# Patient Record
Sex: Female | Born: 1970 | Race: White | Hispanic: No | Marital: Single | Smoking: Never smoker
Health system: Southern US, Community
[De-identification: ages and names within clinical notes are randomized; demographics above are authoritative.]

## PROBLEM LIST (undated history)

## (undated) ENCOUNTER — Emergency Department (HOSPITAL_COMMUNITY): Admission: EM | Payer: BC Managed Care – PPO

## (undated) DIAGNOSIS — F329 Major depressive disorder, single episode, unspecified: Secondary | ICD-10-CM

## (undated) DIAGNOSIS — F419 Anxiety disorder, unspecified: Secondary | ICD-10-CM

## (undated) DIAGNOSIS — M1990 Unspecified osteoarthritis, unspecified site: Secondary | ICD-10-CM

## (undated) DIAGNOSIS — E785 Hyperlipidemia, unspecified: Secondary | ICD-10-CM

## (undated) DIAGNOSIS — K219 Gastro-esophageal reflux disease without esophagitis: Secondary | ICD-10-CM

## (undated) DIAGNOSIS — T7840XA Allergy, unspecified, initial encounter: Secondary | ICD-10-CM

## (undated) DIAGNOSIS — F32A Depression, unspecified: Secondary | ICD-10-CM

## (undated) HISTORY — DX: Gastro-esophageal reflux disease without esophagitis: K21.9

## (undated) HISTORY — PX: TONSILLECTOMY: SUR1361

## (undated) HISTORY — DX: Major depressive disorder, single episode, unspecified: F32.9

## (undated) HISTORY — PX: FRACTURE SURGERY: SHX138

## (undated) HISTORY — PX: OTHER SURGICAL HISTORY: SHX169

## (undated) HISTORY — PX: EYE SURGERY: SHX253

## (undated) HISTORY — DX: Allergy, unspecified, initial encounter: T78.40XA

## (undated) HISTORY — PX: KNEE SURGERY: SHX244

## (undated) HISTORY — DX: Unspecified osteoarthritis, unspecified site: M19.90

## (undated) HISTORY — DX: Anxiety disorder, unspecified: F41.9

## (undated) HISTORY — DX: Depression, unspecified: F32.A

## (undated) HISTORY — DX: Hyperlipidemia, unspecified: E78.5

---

## 2004-06-07 ENCOUNTER — Ambulatory Visit: Payer: Self-pay | Admitting: Family Medicine

## 2004-10-22 ENCOUNTER — Ambulatory Visit: Payer: Self-pay | Admitting: Family Medicine

## 2004-11-29 ENCOUNTER — Encounter: Admission: RE | Admit: 2004-11-29 | Discharge: 2004-11-29 | Payer: Self-pay | Admitting: Family Medicine

## 2006-01-09 ENCOUNTER — Encounter: Admission: RE | Admit: 2006-01-09 | Discharge: 2006-01-09 | Payer: Self-pay | Admitting: Family Medicine

## 2006-01-13 ENCOUNTER — Encounter: Admission: RE | Admit: 2006-01-13 | Discharge: 2006-01-13 | Payer: Self-pay | Admitting: Family Medicine

## 2006-09-15 ENCOUNTER — Emergency Department: Payer: Self-pay | Admitting: Unknown Physician Specialty

## 2009-01-22 ENCOUNTER — Ambulatory Visit: Payer: Self-pay | Admitting: Orthopedic Surgery

## 2009-02-26 ENCOUNTER — Ambulatory Visit: Payer: Self-pay | Admitting: Orthopedic Surgery

## 2010-06-06 ENCOUNTER — Encounter: Payer: Self-pay | Admitting: Family Medicine

## 2010-09-13 ENCOUNTER — Ambulatory Visit: Payer: Self-pay | Admitting: Internal Medicine

## 2010-09-14 ENCOUNTER — Ambulatory Visit: Payer: Self-pay | Admitting: Internal Medicine

## 2011-03-22 ENCOUNTER — Other Ambulatory Visit: Payer: Self-pay | Admitting: Family Medicine

## 2011-03-22 ENCOUNTER — Ambulatory Visit
Admission: RE | Admit: 2011-03-22 | Discharge: 2011-03-22 | Disposition: A | Discharge status: Home / Self Care | Payer: 59 | Source: Ambulatory Visit | Attending: Family Medicine | Admitting: Family Medicine

## 2011-03-22 DIAGNOSIS — M2550 Pain in unspecified joint: Secondary | ICD-10-CM

## 2011-03-22 DIAGNOSIS — M549 Dorsalgia, unspecified: Secondary | ICD-10-CM

## 2012-06-26 ENCOUNTER — Emergency Department (HOSPITAL_COMMUNITY)
Admission: EM | Admit: 2012-06-26 | Discharge: 2012-06-27 | Disposition: A | Discharge status: Home / Self Care | Payer: 59 | Attending: Emergency Medicine | Admitting: Emergency Medicine

## 2012-06-26 ENCOUNTER — Emergency Department (HOSPITAL_COMMUNITY): Payer: 59

## 2012-06-26 ENCOUNTER — Encounter (HOSPITAL_COMMUNITY): Payer: Self-pay | Admitting: Emergency Medicine

## 2012-06-26 DIAGNOSIS — Z79899 Other long term (current) drug therapy: Secondary | ICD-10-CM | POA: Insufficient documentation

## 2012-06-26 DIAGNOSIS — Z791 Long term (current) use of non-steroidal anti-inflammatories (NSAID): Secondary | ICD-10-CM | POA: Insufficient documentation

## 2012-06-26 DIAGNOSIS — Y929 Unspecified place or not applicable: Secondary | ICD-10-CM | POA: Insufficient documentation

## 2012-06-26 DIAGNOSIS — R110 Nausea: Secondary | ICD-10-CM | POA: Insufficient documentation

## 2012-06-26 DIAGNOSIS — S93409A Sprain of unspecified ligament of unspecified ankle, initial encounter: Secondary | ICD-10-CM

## 2012-06-26 DIAGNOSIS — Y9389 Activity, other specified: Secondary | ICD-10-CM | POA: Insufficient documentation

## 2012-06-26 DIAGNOSIS — R296 Repeated falls: Secondary | ICD-10-CM | POA: Insufficient documentation

## 2012-06-26 NOTE — ED Notes (Signed)
PT. REPORTS LEFT ANKLE INJURY WHILE PRACTICING TAE KWON DO THIS EVENING .

## 2012-06-26 NOTE — ED Notes (Signed)
Pt remains in Xray

## 2012-06-26 NOTE — ED Notes (Signed)
Pt alert and mentating appropriately. Pt states rolled ankle today. Pt denies numbness and tingling. Pt denies dizziness/lightheadedness. Pt c/o some nausea from pain. Pt does not show signs of acute distress.

## 2012-06-26 NOTE — ED Provider Notes (Signed)
History  This chart was scribed for Kristine Lee, nurse practitioner working with Kristine Lee, by Kristine Lee, ED Scribe. This patient was seen in room TR06C/TR06C and the patient's care was started at 11:25 PM  CSN: [id number]  Arrival date & time 06/26/12  2204   First MD Initiated Contact with Patient 06/26/12 2325      Chief Complaint  Patient presents with  . Ankle Injury     Patient is a 42 y.o. female presenting with ankle pain. The history is provided by the patient. No language interpreter was used.  Ankle Pain Location:  Ankle Injury: yes   Mechanism of injury: fall   Fall:    Impact surface:  Athletic surface Ankle location:  L ankle Pain details:    Quality:  Aching   Radiates to:  Does not radiate   Severity:  Moderate   Onset quality:  Sudden   Timing:  Constant   Progression:  Unchanged Chronicity:  New Dislocation: yes (Per pt)   Foreign body present:  No foreign bodies Prior injury to area:  No Relieved by:  Nothing Associated symptoms: no back pain     Kristine Lee is a 42 y.o. female who presents to the Emergency Department complaining of sudden onset, non-changing, constant left ankle pain while practicing kicks at [employer] this evening. She states that she fell in mid kick landing on the left ankle dislocating it. She reports that when she grabbed the area, the ankle "popped" back into place. She reports that she has been able to bear weight on the ankle since the incident. She denies taking OTC medications PTA to improve symptoms. She states that she has an orthopedist with [facility] that she follows up with due to other injuries. She denies head trauma, LOC, CP and abdominal pain currently. She does not have a h/o chronic medical conditions and denies smoking and alcohol use.  History reviewed. No pertinent past medical history.  Past Surgical History  Procedure Laterality Date  . Knee surgery    . Feet surgery      No  family history on file.  History  Substance Use Topics  . Smoking status: Never Smoker   . Smokeless tobacco: Not on file  . Alcohol Use: No    No OB history provided.  Review of Systems  Cardiovascular: Negative for chest pain.  Gastrointestinal: Negative for abdominal pain.  Musculoskeletal: Negative for back pain.       Positive for left ankle pain  All other systems reviewed and are negative.    Allergies  Biaxin and Vancomycin  Home Medications   Current Outpatient Rx  Name  Route  Sig  Dispense  Refill  . cetirizine (ZYRTEC) 10 MG tablet   Oral   Take 10 mg by mouth daily.         . diclofenac (CATAFLAM) 50 MG tablet   Oral   Take 50 mg by mouth 3 (three) times daily as needed. Arthritis pain         . ibuprofen (ADVIL,MOTRIN) 200 MG tablet   Oral   Take 200 mg by mouth once. pain         . Naproxen Sodium (ALEVE PO)   Oral   Take 1 tablet by mouth daily. Arthritis pain         . omeprazole (PRILOSEC) 40 MG capsule   Oral   Take 40 mg by mouth daily.  Triage Vitals: BP 130/86  Pulse 84  Temp(Src) 97.7 F (36.5 C) (Oral)  Resp 16  SpO2 100%  LMP 04/05/2012  Physical Exam  Nursing note and vitals reviewed. Constitutional: She is oriented to person, place, and time. She appears well-developed and well-nourished. No distress.  HENT:  Head: Normocephalic and atraumatic.  Eyes: EOM are normal.  Neck: Neck supple. No tracheal deviation present.  Cardiovascular: Normal rate.   Pulmonary/Chest: Effort normal. No respiratory distress.  Musculoskeletal: Normal range of motion.  Edema and ecchymosis to the lateral left malleolus, neurovascularly intact  Neurological: She is alert and oriented to person, place, and time.  Skin: Skin is warm and dry.  Psychiatric: She has a normal mood and affect. Her behavior is normal.    ED Course  Procedures (including critical care time)  DIAGNOSTIC STUDIES: Oxygen Saturation is 100% on room  air, normal by my interpretation.    COORDINATION OF CARE: 11:29 PM-Informed pt of unclear xray results. Discussed treatment plan which includes CT scan of ankle per request by radiologist with pt at bedside and pt agreed to plan.   Labs Reviewed - No data to display Dg Ankle Complete Left  06/26/2012  *RADIOLOGY REPORT*  Clinical Data: Ankle inversion injury.  Soft tissue swelling.  LEFT ANKLE COMPLETE - 3+ VIEW  Comparison: None.  Findings: Soft tissue swelling overlies the lateral malleolus.  No cortical discontinuity to suggest malleolar fracture.  Plafond and talar dome appear intact.  Base of the fifth metatarsal appears intact. On the lateral projection, there is a suspected tibiotalar joint effusion.  There is also an unexpected linear calcification projecting over the sinus tarsi on the lateral projection - possibly a fracture fragment.  IMPRESSION:  1. Possible linear fracture fragment in the vicinity of the sinus tarsi.  It is possible that this simply represents an unusual projection of the anterior portion of the sustentaculum tali.  CT of the ankle is recommended for further characterization. 2.  Soft tissue swelling overlying the lateral malleolus.   Original Report Authenticated By: Van Clines, M.D.      No diagnosis found.  No fracture identified on CT.  Ankle sprain.  Splint, crutches, ortho follow-up (sees [facility]).  MDM   I personally performed the services described in this documentation, which was scribed in my presence. The recorded information has been reviewed and is accurate.         Norman Herrlich, NP 06/27/12 [phone]

## 2012-06-27 MED ORDER — OXYCODONE-ACETAMINOPHEN 5-325 MG PO TABS
2.0000 | ORAL_TABLET | Freq: Once | ORAL | Status: AC
  Administered 2012-06-27: 2 via ORAL
  Filled 2012-06-27: qty 2

## 2012-06-27 MED ORDER — OXYCODONE-ACETAMINOPHEN 5-325 MG PO TABS: 1.0000 | ORAL_TABLET | Freq: Four times a day (QID) | ORAL | Status: DC | PRN

## 2012-06-27 NOTE — ED Provider Notes (Signed)
Medical screening examination/treatment/procedure(s) were performed by non-physician practitioner and as supervising physician I was immediately available for consultation/collaboration.  Orpah Greek, MD 06/27/12 2259

## 2012-06-27 NOTE — Progress Notes (Signed)
Orthopedic Tech Progress Note Patient Details:  Kristine Lee 04-28-1971 [id number]  Ortho Devices Type of Ortho Device: ASO   [name] 06/27/2012, 12:54 AM

## 2012-06-27 NOTE — ED Notes (Signed)
Pt discharged.GCS 15

## 2012-09-07 ENCOUNTER — Ambulatory Visit: Payer: 59 | Admitting: Family Medicine

## 2012-09-07 ENCOUNTER — Ambulatory Visit: Payer: 59

## 2012-09-07 VITALS — BP 109/67 | HR 76 | Temp 97.8°F | Resp 16 | Ht 64.0 in | Wt 134.0 lb

## 2012-09-07 DIAGNOSIS — R059 Cough, unspecified: Secondary | ICD-10-CM

## 2012-09-07 DIAGNOSIS — R509 Fever, unspecified: Secondary | ICD-10-CM

## 2012-09-07 DIAGNOSIS — R05 Cough: Secondary | ICD-10-CM

## 2012-09-07 DIAGNOSIS — R0602 Shortness of breath: Secondary | ICD-10-CM

## 2012-09-07 DIAGNOSIS — J209 Acute bronchitis, unspecified: Secondary | ICD-10-CM

## 2012-09-07 LAB — POCT CBC
Granulocyte percent: 81.7 % — AB (ref 37–80)
HCT, POC: 45.2 % (ref 37.7–47.9)
Hemoglobin: 14.4 g/dL (ref 12.2–16.2)
Lymph, poc: 2.4 (ref 0.6–3.4)
MCH, POC: 29.4 pg (ref 27–31.2)
MCHC: 31.9 g/dL (ref 31.8–35.4)
MCV: 92.4 fL (ref 80–97)
MID (cbc): 0.7 (ref 0–0.9)
MPV: 9 fL (ref 0–99.8)
POC Granulocyte: 13.7 — AB (ref 2–6.9)
POC LYMPH PERCENT: 14.1 % (ref 10–50)
POC MID %: 4.2 %M (ref 0–12)
Platelet Count, POC: 274 10*3/uL (ref 142–424)
RBC: 4.89 M/uL (ref 4.04–5.48)
RDW, POC: 14.8 %
WBC: 16.8 10*3/uL — AB (ref 4.6–10.2)

## 2012-09-07 MED ORDER — FLUTICASONE PROPIONATE 50 MCG/ACT NA SUSP: 2.0000 | Freq: Every day | NASAL | Status: DC

## 2012-09-07 MED ORDER — ALBUTEROL SULFATE (2.5 MG/3ML) 0.083% IN NEBU
2.5000 mg | INHALATION_SOLUTION | Freq: Once | RESPIRATORY_TRACT | Status: AC
  Administered 2012-09-07: 2.5 mg via RESPIRATORY_TRACT

## 2012-09-07 MED ORDER — IPRATROPIUM-ALBUTEROL 20-100 MCG/ACT IN AERS: 1.0000 | INHALATION_SPRAY | Freq: Four times a day (QID) | RESPIRATORY_TRACT | Status: DC | PRN

## 2012-09-07 MED ORDER — LEVOFLOXACIN 500 MG PO TABS: 500.0000 mg | ORAL_TABLET | Freq: Every day | ORAL | Status: DC

## 2012-09-07 MED ORDER — IPRATROPIUM BROMIDE 0.02 % IN SOLN
0.5000 mg | Freq: Once | RESPIRATORY_TRACT | Status: AC
  Administered 2012-09-07: 0.5 mg via RESPIRATORY_TRACT

## 2012-09-07 NOTE — Patient Instructions (Addendum)
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
## 2012-09-25 ENCOUNTER — Ambulatory Visit: Payer: 59

## 2012-09-25 ENCOUNTER — Ambulatory Visit: Payer: 59 | Admitting: Family Medicine

## 2012-09-25 VITALS — BP 124/76 | HR 86 | Temp 99.2°F | Resp 17 | Ht 64.5 in | Wt 133.0 lb

## 2012-09-25 DIAGNOSIS — F411 Generalized anxiety disorder: Secondary | ICD-10-CM

## 2012-09-25 DIAGNOSIS — G4700 Insomnia, unspecified: Secondary | ICD-10-CM

## 2012-09-25 DIAGNOSIS — R062 Wheezing: Secondary | ICD-10-CM

## 2012-09-25 DIAGNOSIS — R0602 Shortness of breath: Secondary | ICD-10-CM

## 2012-09-25 DIAGNOSIS — F418 Other specified anxiety disorders: Secondary | ICD-10-CM

## 2012-09-25 LAB — POCT CBC
Granulocyte percent: 54 %G (ref 37–80)
HCT, POC: 40.3 % (ref 37.7–47.9)
Hemoglobin: 12.7 g/dL (ref 12.2–16.2)
MCV: 94.2 fL (ref 80–97)
POC Granulocyte: 3.8 (ref 2–6.9)
RBC: 4.28 M/uL (ref 4.04–5.48)
RDW, POC: 14.6 %

## 2012-09-25 LAB — GLUCOSE, POCT (MANUAL RESULT ENTRY): POC Glucose: 86 mg/dl (ref 70–99)

## 2012-09-25 MED ORDER — CLONAZEPAM 0.5 MG PO TABS: 0.2500 mg | ORAL_TABLET | Freq: Two times a day (BID) | ORAL | Status: DC | PRN

## 2012-09-25 MED ORDER — PREDNISONE 10 MG PO TABS: 10.0000 mg | ORAL_TABLET | Freq: Every day | ORAL | Status: DC

## 2012-09-25 MED ORDER — BECLOMETHASONE DIPROPIONATE 80 MCG/ACT IN AERS: 1.0000 | INHALATION_SPRAY | Freq: Two times a day (BID) | RESPIRATORY_TRACT | Status: DC

## 2012-09-25 MED ORDER — CITALOPRAM HYDROBROMIDE 20 MG PO TABS: 20.0000 mg | ORAL_TABLET | Freq: Every day | ORAL | Status: DC

## 2012-09-25 NOTE — Progress Notes (Signed)
Chest x-ray looks clear. Discussed patient Kristine Sorrel, PA. Agree with treatment and plan.

## 2012-09-25 NOTE — Patient Instructions (Signed)
Start the prednisone on Wed am Start the inhaler Tuesday night Start the Klonopin when you are comfortable Celexa - start on Friday with lunch - when the prednisone is gone take in the am
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
## 2012-10-20 IMAGING — CR DG CERVICAL SPINE 2 OR 3 VIEWS
3 series · 3 of 3 positions shown · non-contrast
Comparison: None.

CLINICAL DATA: Neck pain

CERVICAL SPINE - 2-3 VIEW

[w c-spine lat]
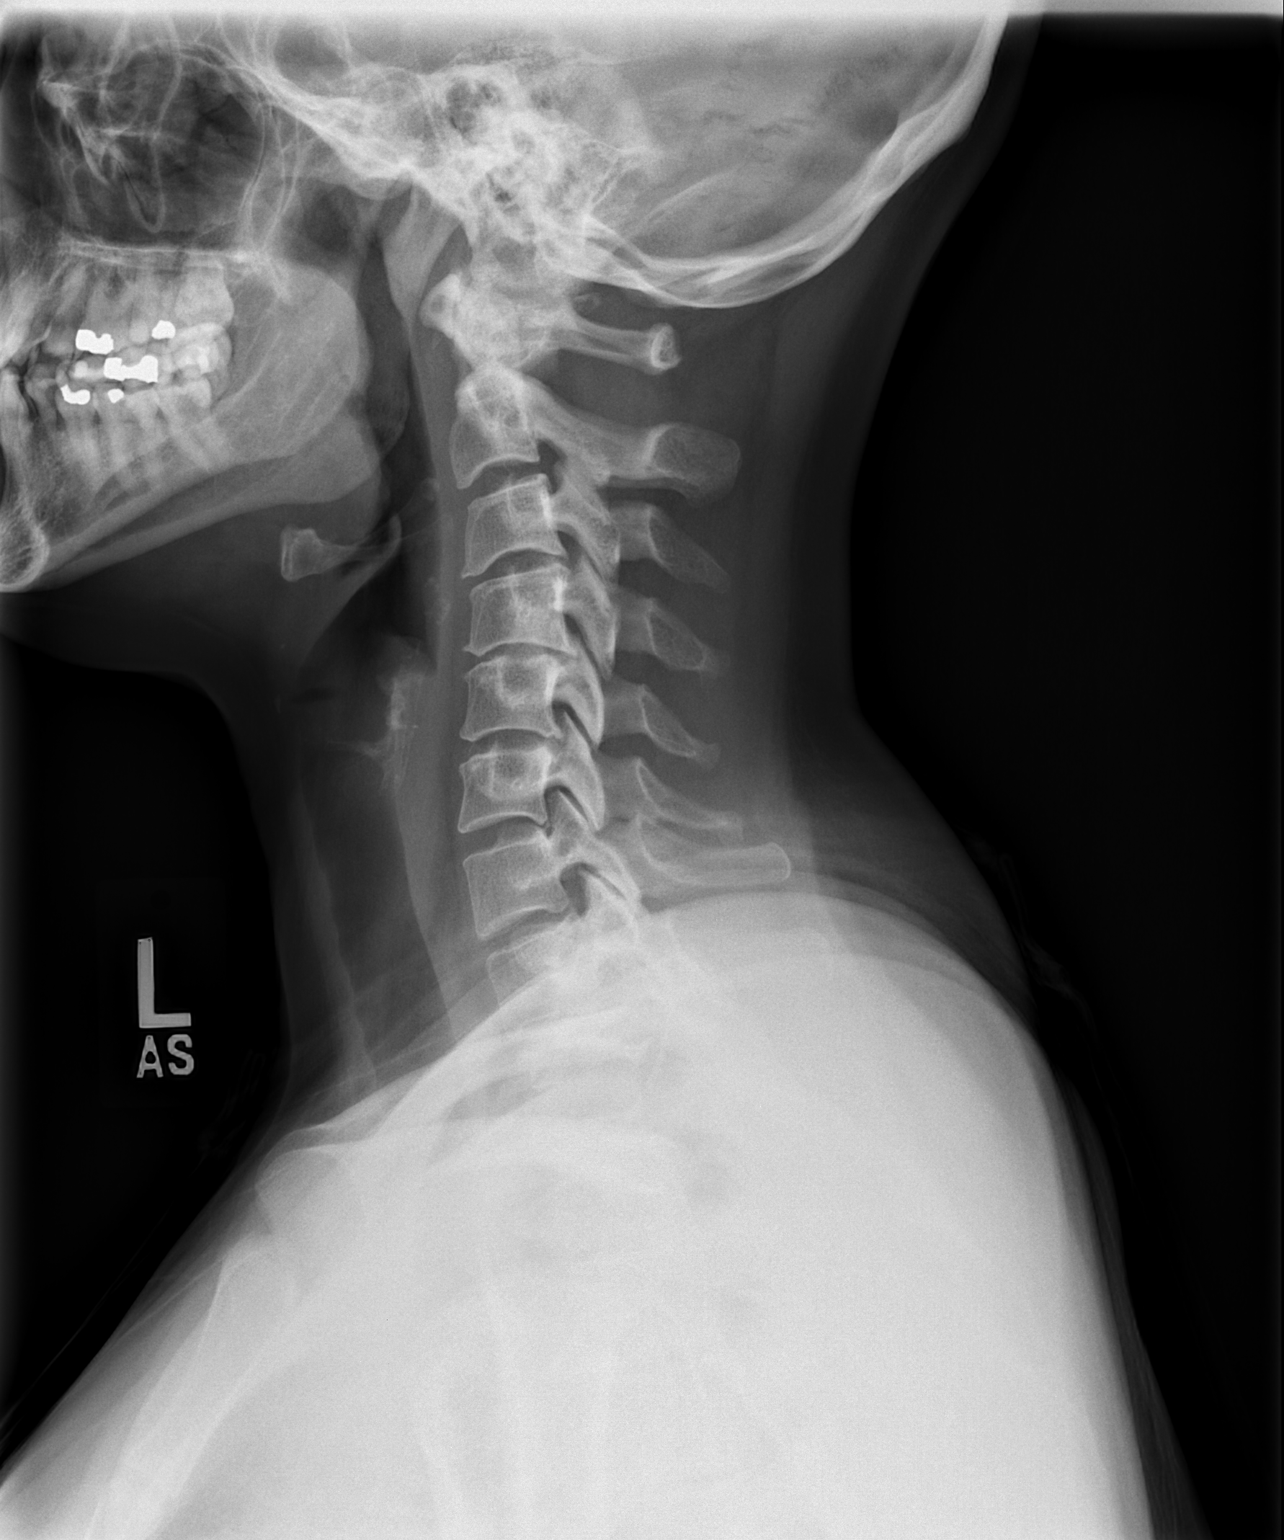

[w c-spine a.p. *]
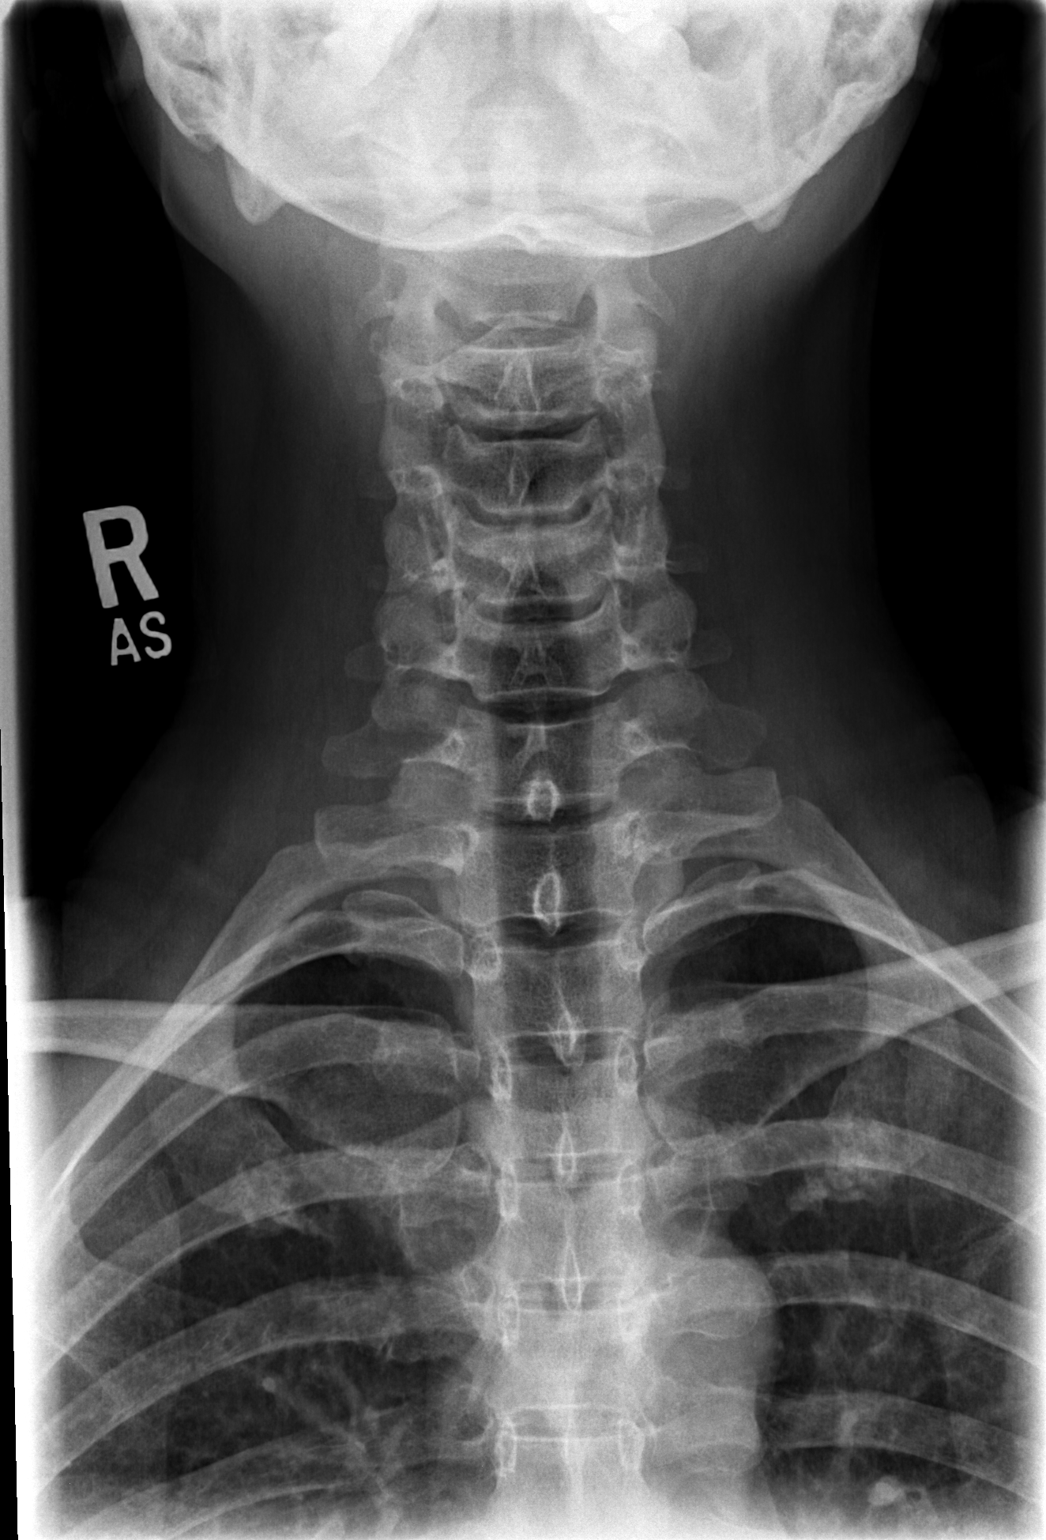

[w c-spine odontoid *]
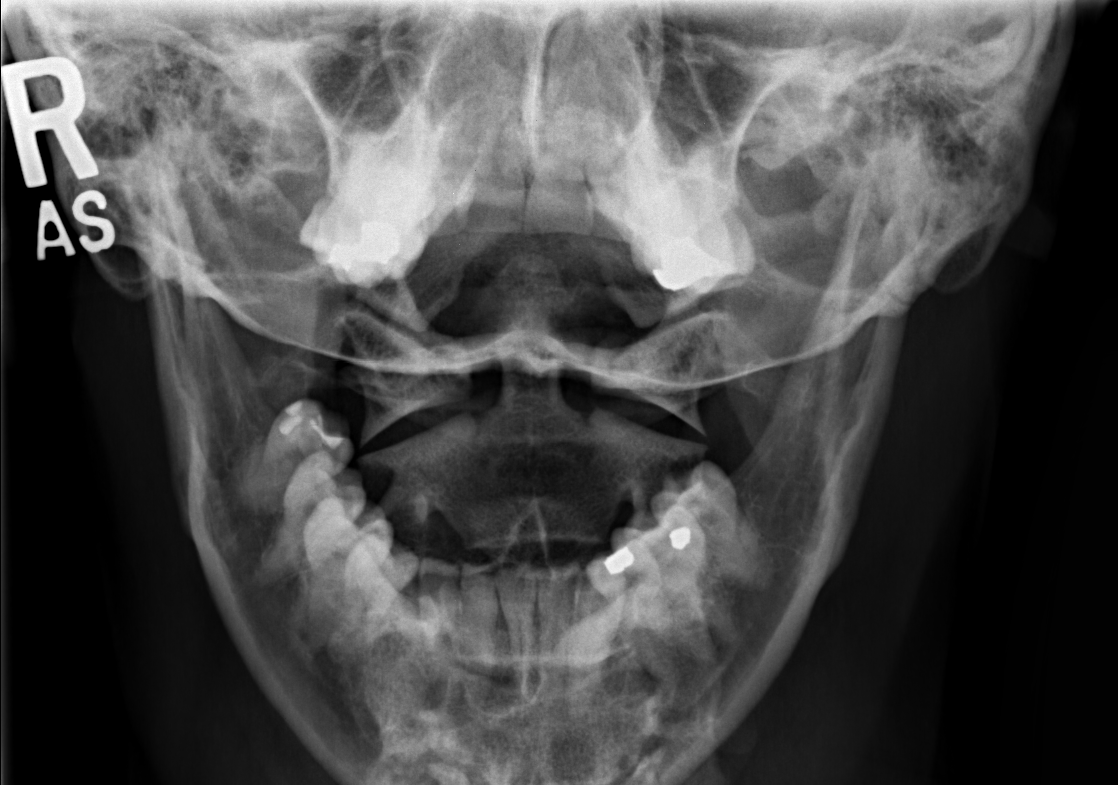

[3 of 3 positions shown; findings below may reference images not displayed]

FINDINGS: Reversal of the normal cervical lordosis.

No evidence of fracture or dislocation.  The vertebral body heights
are maintained.  The dens appears intact.  Lateral masses of C1 are
symmetric.

No prevertebral soft tissue swelling.

Mild multilevel degenerative changes, most prominent at C4-5.

Visualized lung apices are clear.
IMPRESSION: No fracture or dislocation is seen.

Mild multilevel degenerative changes, most prominent at C4-5.

## 2012-10-20 IMAGING — CR DG WRIST COMPLETE 3+V*R*
4 series · 4 of 4 positions shown · non-contrast
Comparison: None.

CLINICAL DATA: Polyarthralgia

RIGHT WRIST - COMPLETE 3+ VIEW

[x wrist pa right]
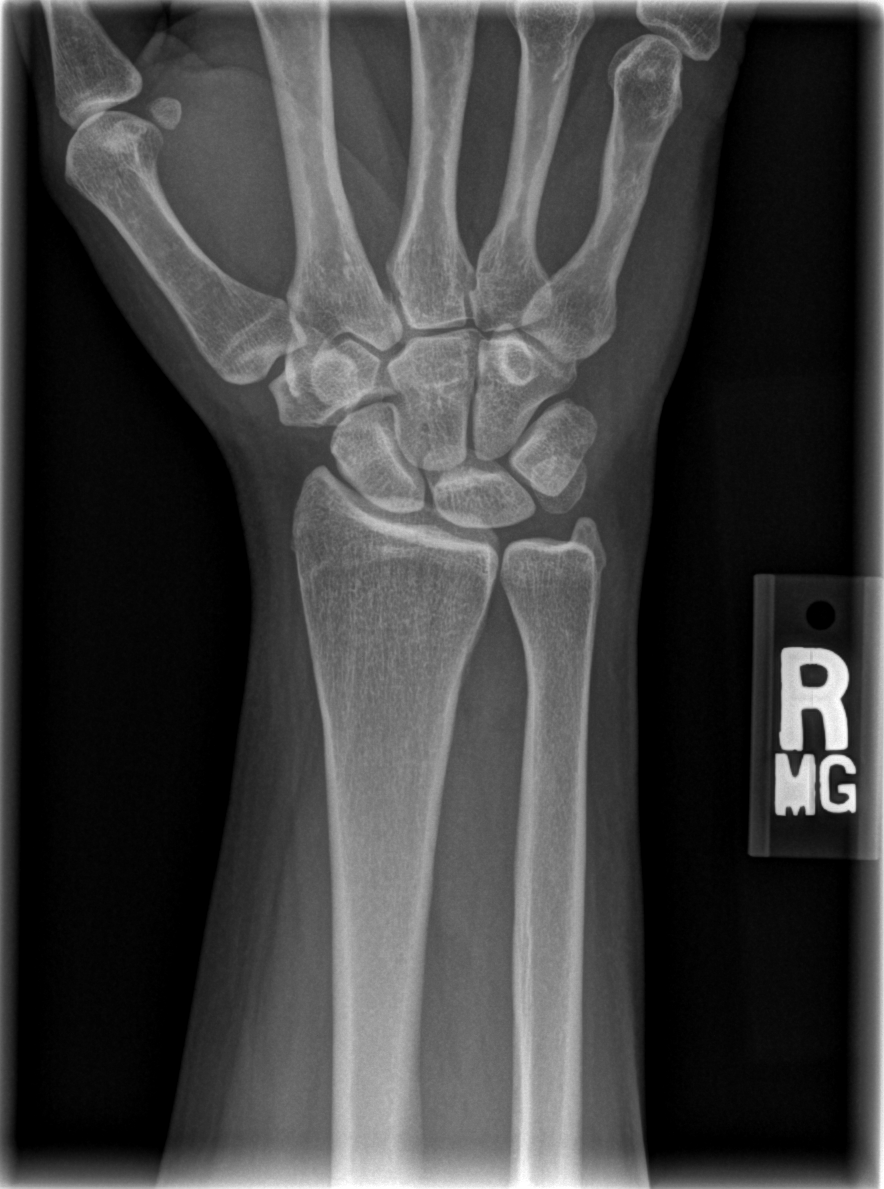

[x wrist obl right]
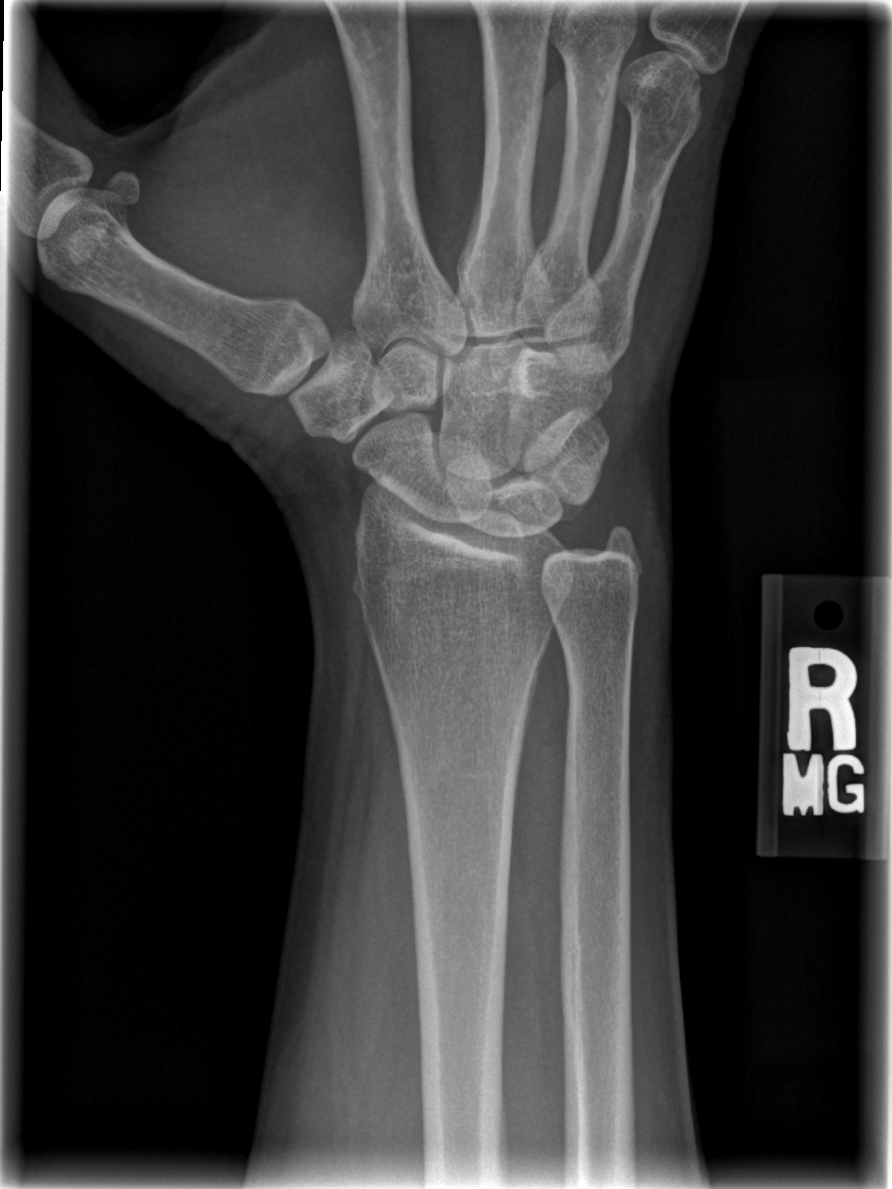

[x wrist lat right]
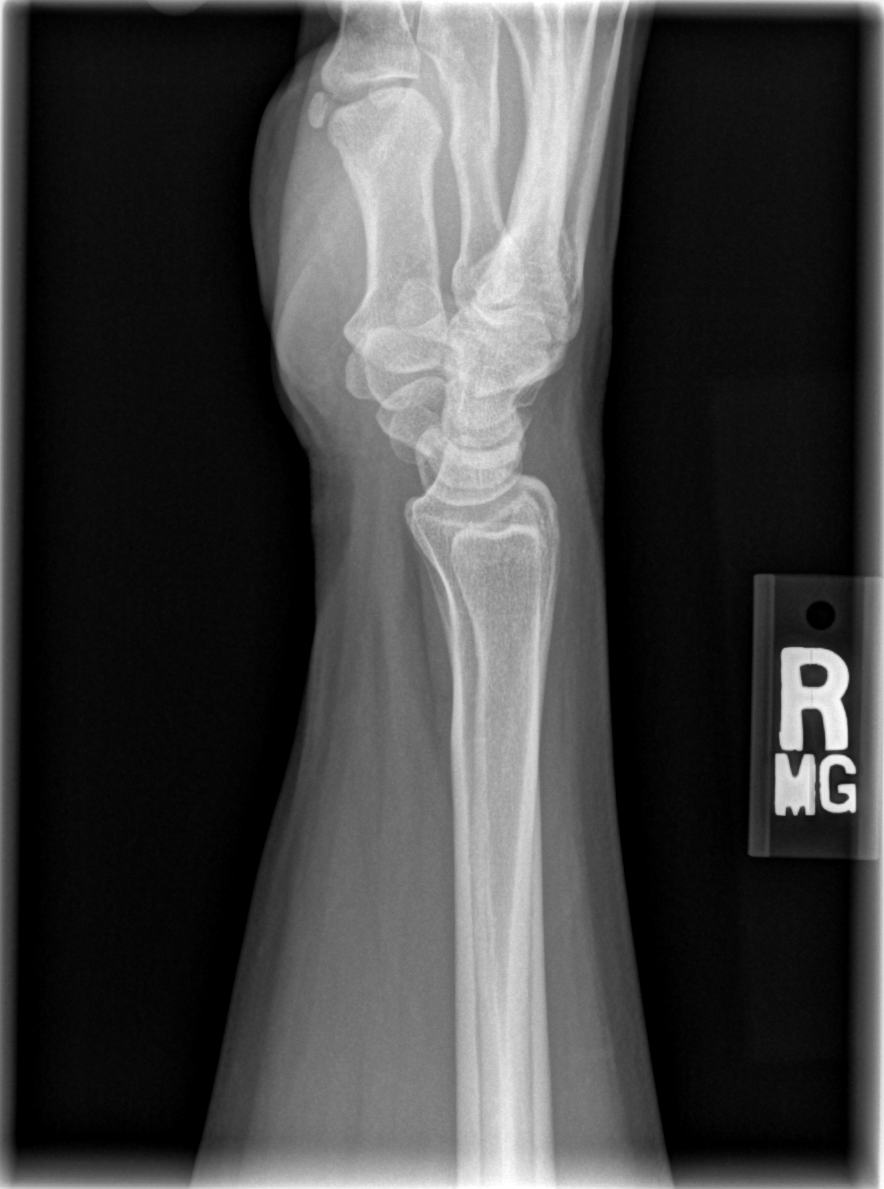

[x navicular]
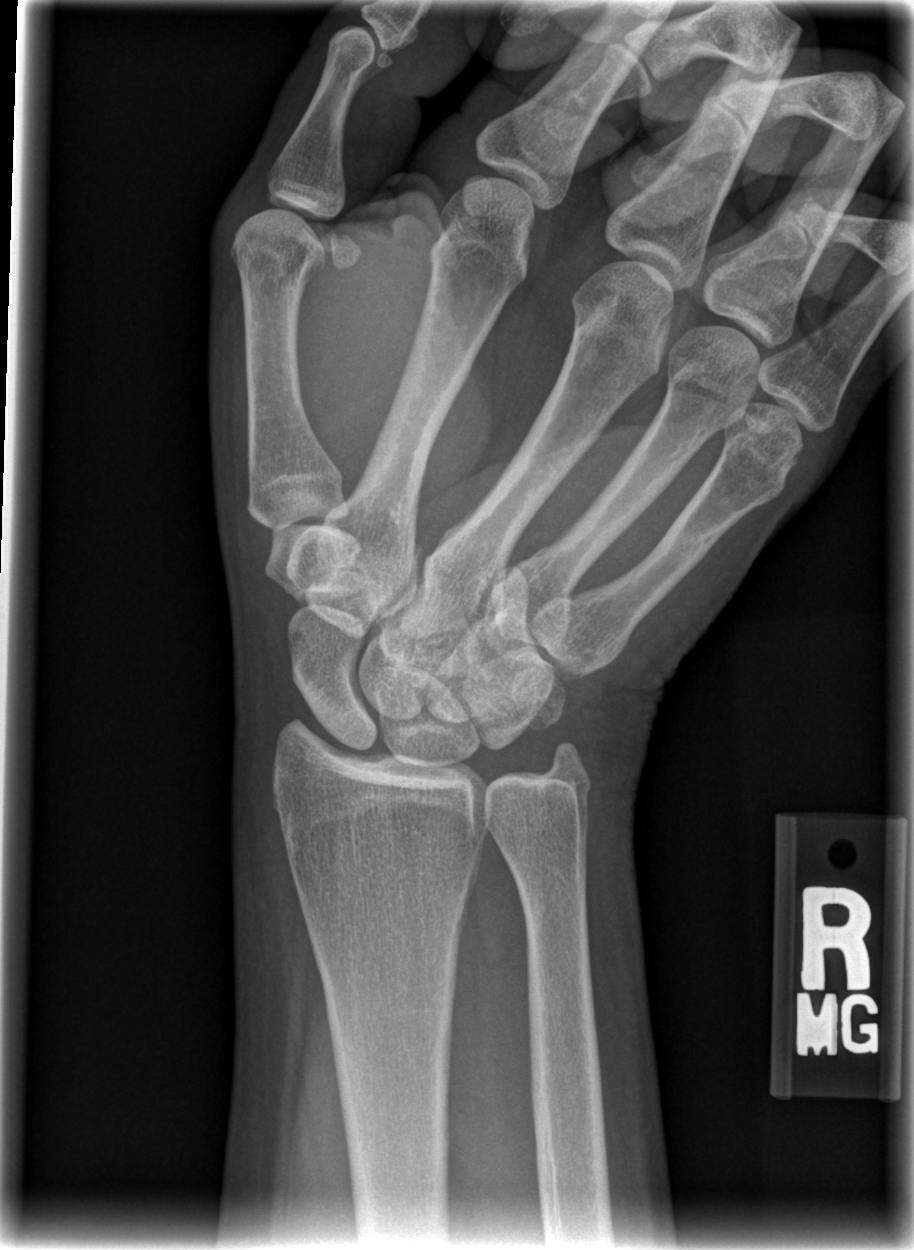

[4 of 4 positions shown; findings below may reference images not displayed]

FINDINGS: No acute fracture and no dislocation.  Small cysts in the
lateral triquetrum is of unknown significance.  Unremarkable soft
tissues.  Joint spaces are maintained.
IMPRESSION: No acute bony pathology.

## 2012-10-25 ENCOUNTER — Ambulatory Visit: Payer: 59 | Admitting: Family Medicine

## 2012-10-25 ENCOUNTER — Ambulatory Visit: Payer: 59

## 2012-10-25 VITALS — BP 161/66 | HR 68 | Temp 98.0°F | Resp 16 | Ht 64.0 in | Wt 133.0 lb

## 2012-10-25 DIAGNOSIS — G4700 Insomnia, unspecified: Secondary | ICD-10-CM

## 2012-10-25 DIAGNOSIS — F418 Other specified anxiety disorders: Secondary | ICD-10-CM

## 2012-10-25 DIAGNOSIS — F411 Generalized anxiety disorder: Secondary | ICD-10-CM

## 2012-10-25 DIAGNOSIS — M25519 Pain in unspecified shoulder: Secondary | ICD-10-CM

## 2012-10-25 DIAGNOSIS — F341 Dysthymic disorder: Secondary | ICD-10-CM

## 2012-10-25 DIAGNOSIS — M25511 Pain in right shoulder: Secondary | ICD-10-CM

## 2012-10-25 MED ORDER — TRAZODONE HCL 50 MG PO TABS: 25.0000 mg | ORAL_TABLET | Freq: Every day | ORAL | Status: DC

## 2012-10-25 MED ORDER — ALPRAZOLAM 0.25 MG PO TABS: 0.1250 mg | ORAL_TABLET | Freq: Every day | ORAL | Status: DC | PRN

## 2012-10-25 MED ORDER — CITALOPRAM HYDROBROMIDE 20 MG PO TABS: 20.0000 mg | ORAL_TABLET | Freq: Every day | ORAL | Status: DC

## 2012-10-25 NOTE — Progress Notes (Signed)
   [address], [place] [address]   Phone [phone]  Subjective:    Patient ID: Kristine Lee, female    DOB: August 24, 1970, 42 y.o.   MRN: [id number]  HPI Pt presents for recheck.  She has been doing much better.  She is sleeping a little better at night but the Klonopin either makes her groggy in the am or does not help her sleep that much.  She is starting to make plans which was different than last month - she still have trouble with meals and sleep but probably due to the fact that she is alone after so many years.  She is more confident in her job and feels like she is back to where she was there.  She has tried some left over Xanax during the day before hard conversations with her ex and they help her calm down so she can think before she talks.  She is unable to take the Klonopin during the day because she is to groggy.  She is still having pain in her R clavicle area in the medial 1/3.  Mainly hurts when she tries to go to bed at night.  No SOB or pain into her arm - the pain is very localized.   Review of Systems  Musculoskeletal:       Clavicle pain on the R still - mainly gets her when she sleeps and rolls over.         Objective:   Physical Exam  Vitals reviewed. Constitutional: She is oriented to person, place, and time. She appears well-developed and well-nourished.  HENT:  Head: Normocephalic and atraumatic.  Right Ear: External ear normal.  Left Ear: External ear normal.  Eyes: Conjunctivae are normal.  Pulmonary/Chest: Effort normal.  Musculoskeletal:  Pain over specific area of clavicle - medial 1/3 of clavicle  Neurological: She is alert and oriented to person, place, and time.  Skin: Skin is warm and dry.  Psychiatric: She has a normal mood and affect. Her behavior is normal. Judgment and thought content normal.  Seems less sad.   [facility] reading (PRIMARY) by  Dr. Marin Comment.  Neg.     Assessment & Plan:  Insomnia - d/c Klonopin makes her to groggy at a dose that  helps her sleep.  Plan: traZODone (DESYREL) 50 MG tablet  Depression with anxiety - Plan: ALPRAZolam (XANAX) 0.25 MG tablet prn anxiety.  Situational anxiety - Continue on same dose.  Plan: citalopram (CELEXA) 20 MG tablet  Pain in joint, shoulder region, right - Plan: DG Clavicle Right - appears to not have a fracture so patient must have a bone bruise and would except it to continue to improve over the next several weeks.  Pt to be in contact in the next 3-4 wks depending on the the results for sleep.  If doing good can report by phone for a refill of meds for the next month.  Windell Hummingbird PA-C 10/25/2012 6:12 PM

## 2012-11-13 ENCOUNTER — Telehealth: Payer: Self-pay

## 2012-11-13 NOTE — Telephone Encounter (Signed)
Called her to advise. She states she was intolerant to the trazodone and has not been taking this. I have discontinued this from her list/ to you [facility]

## 2012-11-13 NOTE — Telephone Encounter (Signed)
I am worried that pt wants to stop already because she has not been on it very long but if she feels like her depressed mood is resolved she should go to 1/2 pill daily for 1 week and then 1/2 pill every other day for 1 wk then she can stop them.

## 2012-11-13 NOTE — Telephone Encounter (Signed)
PT HAVE QUESTIONS REGARDING HER MEDS. PLEASE CALL [phone]

## 2012-11-13 NOTE — Telephone Encounter (Signed)
Called patient, she is on Celexa and she wants to wean from this medication , please advise on taper dose.

## 2013-02-28 ENCOUNTER — Ambulatory Visit: Payer: 59 | Admitting: Physician Assistant

## 2013-02-28 VITALS — BP 114/60 | HR 71 | Temp 98.2°F | Resp 16 | Ht 66.75 in | Wt 141.6 lb

## 2013-02-28 DIAGNOSIS — M62838 Other muscle spasm: Secondary | ICD-10-CM

## 2013-02-28 DIAGNOSIS — F341 Dysthymic disorder: Secondary | ICD-10-CM

## 2013-02-28 DIAGNOSIS — F418 Other specified anxiety disorders: Secondary | ICD-10-CM

## 2013-02-28 MED ORDER — ESCITALOPRAM OXALATE 10 MG PO TABS: 10.0000 mg | ORAL_TABLET | Freq: Every day | ORAL | Status: DC

## 2013-02-28 MED ORDER — CYCLOBENZAPRINE HCL 5 MG PO TABS: 5.0000 mg | ORAL_TABLET | Freq: Every evening | ORAL | Status: DC | PRN

## 2013-02-28 NOTE — Progress Notes (Signed)
   [address] [place] [address]   Phone [phone]  Subjective:    Patient ID: Kristine Lee, female    DOB: Jul 23, 1970, 42 y.o.   MRN: [id number]  HPI  Pt presents to clinic for recheck of depression.  She went off her Celexa and has determined that she needs to be on something for her depression.  He depression has worsened and now she is having resulting anxiety.  She is also having some menopausal vasomotor symptoms as well as irritability.  She is seeing her GYN who has started her on LoLoestrin to help with those symptoms and she is sleeping better since the start of that med.  She feels worthless, no hope and feels no direction and no control over anything while she is home.  She is fine at work where she feel like she has direction in her life.  She has the klonopin which she takes at night and the Xanax which she takes during the day but she rarely takes either of those.  Review of Systems  Psychiatric/Behavioral: Positive for sleep disturbance (recently improved) and dysphoric mood. The patient is nervous/anxious.        Objective:   Physical Exam  Vitals reviewed. Constitutional: She is oriented to person, place, and time. She appears well-developed and well-nourished.  HENT:  Head: Normocephalic and atraumatic.  Right Ear: External ear normal.  Left Ear: External ear normal.  Pulmonary/Chest: Effort normal.  Musculoskeletal:       Cervical back: She exhibits spasm (trapezius area).  Neurological: She is alert and oriented to person, place, and time.  Skin: Skin is warm and dry.  Psychiatric: She has a normal mood and affect. Her behavior is normal. Judgment and thought content normal.       Assessment & Plan:  Muscle spasms of neck -Pt is having muscle spasms in her neck and upper back and she is having result pain - She will maybe try to get a massage -- Plan: cyclobenzaprine (FLEXERIL) 5 MG tablet  Depression with anxiety - Plan: escitalopram (LEXAPRO) 10 MG  tablet  She will call in 1 month with her status if she is doing well for a refill.  If she is not having results or problems with the med she will f/u with me in 1 month -   Elizabeth Sauer 02/28/2013 4:59 PM

## 2013-03-28 ENCOUNTER — Encounter: Payer: Self-pay | Admitting: Physician Assistant

## 2013-03-28 ENCOUNTER — Other Ambulatory Visit: Payer: Self-pay | Admitting: Physician Assistant

## 2013-03-28 NOTE — Telephone Encounter (Signed)
LMOM for pt to CB w/status after being on Lexapro for 1 mos. Then send to Judson Roch for review.

## 2013-03-29 NOTE — Telephone Encounter (Signed)
Pt CB and reported that she is feeling better since beginning the Lexapro. She stated that since it may not be up to maximum effectiveness for a couple of more weeks, she would like to continue for another month to give it adequate test before changing anything. I am sending another month RF and have advised pt to let us know in another month how it is working for her, or sooner if she doesn't feel that it is working well enough. Pt agreed. Carlis Stable.

## 2013-04-18 ENCOUNTER — Ambulatory Visit (INDEPENDENT_AMBULATORY_CARE_PROVIDER_SITE_OTHER): Payer: 59 | Admitting: Physician Assistant

## 2013-04-18 VITALS — BP 120/72 | HR 74 | Temp 97.5°F | Resp 16 | Ht 66.25 in | Wt 149.0 lb

## 2013-04-18 DIAGNOSIS — F418 Other specified anxiety disorders: Secondary | ICD-10-CM

## 2013-04-18 DIAGNOSIS — G4700 Insomnia, unspecified: Secondary | ICD-10-CM

## 2013-04-18 DIAGNOSIS — F329 Major depressive disorder, single episode, unspecified: Secondary | ICD-10-CM | POA: Insufficient documentation

## 2013-04-18 DIAGNOSIS — F32A Depression, unspecified: Secondary | ICD-10-CM | POA: Insufficient documentation

## 2013-04-18 DIAGNOSIS — F341 Dysthymic disorder: Secondary | ICD-10-CM

## 2013-04-18 MED ORDER — FLUOXETINE HCL 20 MG PO TABS: 20.0000 mg | ORAL_TABLET | Freq: Every day | ORAL | Status: DC

## 2013-04-18 MED ORDER — CLONAZEPAM 0.5 MG PO TABS: 0.2500 mg | ORAL_TABLET | Freq: Every evening | ORAL | Status: DC | PRN

## 2013-04-18 MED ORDER — ALPRAZOLAM 0.25 MG PO TABS: 0.1250 mg | ORAL_TABLET | Freq: Two times a day (BID) | ORAL | Status: DC | PRN

## 2013-04-18 NOTE — Progress Notes (Signed)
   Subjective:    Patient ID: Kristine Lee, female    DOB: 24-Aug-1970, 42 y.o.   MRN: [id number]  HPI Pt presents to clinic for recheck.  She has been on the Lexapro for about 6 wks and she is feeling no different.  In fact she thinks that her depression is the worst it has been.  She is irritable and easily frustratable and short with people - non of these she has even been in the past.  She also is sad and hopeless most of the time and has had thoughts of suicide and how it would be easier to be dead than feel like this but has no plans.  She has been using more of Xanax during the day for her aggravation.  And she rarely uses the Klonopin to help her sleep.  Review of Systems     Objective:   Physical Exam  Vitals reviewed. Constitutional: She is oriented to person, place, and time. She appears well-developed and well-nourished.  Pulmonary/Chest: Effort normal.  Neurological: She is alert and oriented to person, place, and time.  Skin: Skin is warm and dry.  Psychiatric: Her behavior is normal. Judgment and thought content normal. She exhibits a depressed mood.       Assessment & Plan:  Depression with anxiety - Plan: FLUoxetine (PROZAC) 20 MG tablet, ALPRAZolam (XANAX) 0.25 MG tablet  Insomnia - Plan: clonazePAM (KLONOPIN) 0.5 MG tablet  We will stop her lexapro becaue she is getting no response.  We will start Prozac - we did discuss the length of time to titrate off the medication but the hope is that her mother and sister get response she will also. Recheck in 1 month.  Windell Hummingbird PA-C 04/18/2013 7:08 PM

## 2013-06-04 ENCOUNTER — Telehealth: Payer: Self-pay

## 2013-06-04 DIAGNOSIS — F418 Other specified anxiety disorders: Secondary | ICD-10-CM

## 2013-06-04 NOTE — Telephone Encounter (Signed)
[place] - Pt says the prozac is working excellent for her and she is requesting a refill  [phone]

## 2013-06-05 MED ORDER — FLUOXETINE HCL 20 MG PO TABS: 20.0000 mg | ORAL_TABLET | Freq: Every day | ORAL | Status: DC

## 2013-06-05 NOTE — Telephone Encounter (Signed)
Last OV states pt to check again in 1 month. Pt reports prozac is working perfect for her. Can we send in a refill or do you need to have her RTC?

## 2013-06-05 NOTE — Telephone Encounter (Signed)
If she is happy with the results I will send in a Rx for the same dosage and I need to see her next month.  If she feels like she could get improvement I would like to see her but I want to make sure she does not run out.  I have sent a month of her current dose to the pharmacy.

## 2013-06-06 NOTE — Telephone Encounter (Signed)
Spoke with pt, she is doing very well on Prozac and would like refills. She states she has done a complete turn around.

## 2013-06-06 NOTE — Telephone Encounter (Signed)
LM advised rx at [facility] call to find out if she is satisfied with the Prozac and wants refills?

## 2013-06-06 NOTE — Telephone Encounter (Signed)
I have refilled for a month but would like to touch base with her by office visit before they run out.

## 2013-06-07 NOTE — Telephone Encounter (Signed)
Pt advised.  Transferred her to make an appt with Windell Hummingbird before her medication runs out.

## 2013-07-17 ENCOUNTER — Encounter: Payer: Self-pay | Admitting: Physician Assistant

## 2013-07-17 ENCOUNTER — Ambulatory Visit (INDEPENDENT_AMBULATORY_CARE_PROVIDER_SITE_OTHER): Payer: 59 | Admitting: Physician Assistant

## 2013-07-17 VITALS — BP 107/62 | HR 78 | Temp 98.0°F | Resp 16 | Ht 64.0 in | Wt 145.0 lb

## 2013-07-17 DIAGNOSIS — F341 Dysthymic disorder: Secondary | ICD-10-CM

## 2013-07-17 DIAGNOSIS — J309 Allergic rhinitis, unspecified: Secondary | ICD-10-CM

## 2013-07-17 DIAGNOSIS — F418 Other specified anxiety disorders: Secondary | ICD-10-CM

## 2013-07-17 DIAGNOSIS — J302 Other seasonal allergic rhinitis: Secondary | ICD-10-CM

## 2013-07-17 DIAGNOSIS — K219 Gastro-esophageal reflux disease without esophagitis: Secondary | ICD-10-CM

## 2013-07-17 DIAGNOSIS — K449 Diaphragmatic hernia without obstruction or gangrene: Secondary | ICD-10-CM

## 2013-07-17 MED ORDER — FLUOXETINE HCL 20 MG PO TABS
20.0000 mg | ORAL_TABLET | Freq: Every day | ORAL | Status: DC
Start: 2013-07-17 — End: 2013-07-25

## 2013-07-17 MED ORDER — LORATADINE 10 MG PO TABS: 10.0000 mg | ORAL_TABLET | Freq: Every day | ORAL | Status: DC

## 2013-07-17 MED ORDER — FLUTICASONE PROPIONATE 50 MCG/ACT NA SUSP: 2.0000 | Freq: Every day | NASAL | Status: DC

## 2013-07-17 MED ORDER — OMEPRAZOLE 40 MG PO CPDR: 40.0000 mg | DELAYED_RELEASE_CAPSULE | Freq: Every day | ORAL | Status: DC

## 2013-07-17 NOTE — Progress Notes (Signed)
   Subjective:    Patient ID: Kristine Lee, female    DOB: Apr 13, 1971, 43 y.o.   MRN: [id number]  HPI Pt presents to clinic for recheck.  She is doing well.  She is back to doing things that she likes to which has been a while.  She almost never is having panic attacks and is happy with her results.  She needs a refill of her GERD meds - she has a hiatal hernia with resulting reflux and well controlled on her PPI.  She has allergies which she uses Flonase and Claritin for good control.  Review of Systems     Objective:   Physical Exam  Vitals reviewed. Constitutional: She is oriented to person, place, and time. She appears well-developed and well-nourished.  HENT:  Head: Normocephalic and atraumatic.  Right Ear: External ear normal.  Left Ear: External ear normal.  Eyes: Conjunctivae are normal.  Neck: Normal range of motion.  Cardiovascular: Normal rate, regular rhythm and normal heart sounds.   No murmur heard. Pulmonary/Chest: Effort normal and breath sounds normal. She has no wheezes.  Neurological: She is alert and oriented to person, place, and time.  Skin: Skin is warm and dry.  Psychiatric: She has a normal mood and affect. Her behavior is normal. Judgment and thought content normal.       Assessment & Plan:  Depression with anxiety - Plan: FLUoxetine (PROZAC) 20 MG tablet  Hiatal hernia with gastroesophageal reflux - Plan: omeprazole (PRILOSEC) 40 MG capsule  Seasonal allergies - Plan: fluticasone (FLONASE) 50 MCG/ACT nasal spray, loratadine (CLARITIN) 10 MG tablet  Well controlled depression on Prozac.  She will continue her meds and recheck with me in 6 months.  Windell Hummingbird PA-C 07/17/2013 7:20 PM

## 2013-07-25 ENCOUNTER — Other Ambulatory Visit: Payer: Self-pay

## 2013-07-25 DIAGNOSIS — F418 Other specified anxiety disorders: Secondary | ICD-10-CM

## 2013-07-25 MED ORDER — FLUOXETINE HCL 20 MG PO TABS: 20.0000 mg | ORAL_TABLET | Freq: Every day | ORAL | Status: DC

## 2013-11-11 ENCOUNTER — Other Ambulatory Visit: Payer: Self-pay | Admitting: Physician Assistant

## 2013-11-12 NOTE — Telephone Encounter (Signed)
Pt states that she is going out of town today at 2 and would like to have this called in asap. Best# [phone]

## 2013-11-12 NOTE — Telephone Encounter (Signed)
[facility] not get [facility] have the year's RFs sent in at [place] in March, so resent the RFs and notified pt on VM.

## 2014-01-29 ENCOUNTER — Ambulatory Visit: Payer: 59 | Admitting: Physician Assistant

## 2014-01-30 ENCOUNTER — Ambulatory Visit: Payer: 59 | Admitting: Physician Assistant

## 2014-02-22 ENCOUNTER — Ambulatory Visit (INDEPENDENT_AMBULATORY_CARE_PROVIDER_SITE_OTHER): Payer: 59 | Admitting: Physician Assistant

## 2014-02-22 VITALS — BP 114/72 | HR 61 | Temp 98.0°F | Resp 18 | Ht 64.0 in | Wt 136.0 lb

## 2014-02-22 DIAGNOSIS — F418 Other specified anxiety disorders: Secondary | ICD-10-CM

## 2014-02-22 DIAGNOSIS — J302 Other seasonal allergic rhinitis: Secondary | ICD-10-CM

## 2014-02-22 DIAGNOSIS — K219 Gastro-esophageal reflux disease without esophagitis: Secondary | ICD-10-CM

## 2014-02-22 MED ORDER — ALPRAZOLAM 0.25 MG PO TABS: 0.1250 mg | ORAL_TABLET | Freq: Two times a day (BID) | ORAL | Status: DC | PRN

## 2014-02-22 MED ORDER — FLUOXETINE HCL 20 MG PO TABS: 20.0000 mg | ORAL_TABLET | Freq: Every day | ORAL | Status: DC

## 2014-02-22 MED ORDER — FLUTICASONE PROPIONATE 50 MCG/ACT NA SUSP: 2.0000 | Freq: Every day | NASAL | Status: DC

## 2014-02-22 MED ORDER — OMEPRAZOLE 40 MG PO CPDR: DELAYED_RELEASE_CAPSULE | ORAL | Status: DC

## 2014-02-22 NOTE — Patient Instructions (Addendum)
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

## 2014-02-22 NOTE — Progress Notes (Signed)
   Subjective:    Patient ID: Kristine Lee, female    DOB: 04-27-1971, 43 y.o.   MRN: [id number]  HPI Pt presents to clinic for a recheck.  She is doing really well in respects to her depression and anxiety.  She takes the Xanax maybe once a week and only when she is at work.  She has the Klonopin but never uses it. She is doing well emotional.  She is stressed at work because they are short staffed and busy.  She is getting ready to move.  Active outdoors and still leads a healthy lifestyle.  She has a few questions about her labs that she had done through his insurance company but does not have the results with her.  Her A1C was 5.8 and that worries her.  Her LDL was in the 170s but her HDL was high 60s.  She also has some elevated ALT/AST and she does not drink ETOH but has h/o elevated LFTs in the past but no work-up was done.  She did sustain a needle stick at work from a A1C probe in 11/14 and is still doing f/u but she is not sure they are checking for HIV and she wonders about that.  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  Review of Systems     Objective:   Physical Exam  Vitals reviewed. Constitutional: She is oriented to person, place, and time. She appears well-developed and well-nourished.  HENT:  Head: Normocephalic and atraumatic.  Right Ear: External ear normal.  Left Ear: External ear normal.  Pulmonary/Chest: Effort normal.  Neurological: She is alert and oriented to person, place, and time.  Skin: Skin is warm and dry.  Psychiatric: She has a normal mood and affect. Her behavior is normal. Judgment and thought content normal.        Assessment & Plan:  Depression with anxiety - Well controlled - continue current medication.s  Plan: FLUoxetine (PROZAC) 20 MG tablet, ALPRAZolam (XANAX) 0.25 MG tablet  Seasonal allergies - Well controlled. Continue current medications. Plan: fluticasone (FLONASE) 50 MCG/ACT nasal  spray  Gastroesophageal reflux disease without esophagitis - Well controlled continue current medications.  Plan: omeprazole (PRILOSEC) 40 MG capsule  Elevated Cholesterol - she plans to have a liposcience done at work to help differeniate type of LDL elevation she has.  She is going to continue her healthy lifestyle and maybe add fish oil and/or red yeast rice.  Exposure to blood - we discussed f/u protocol per CDC and she is going to take this with her to her f/u appt  Elevated LFTs - she is going to get her numbers - these need to be repeated and if still high she needs Hepatitis panel and or liver US.    Her questions were answered and she agrees with the plan.  Windell Hummingbird PA-C  [facility] 02/22/2014 2:11 PM

## 2014-03-24 ENCOUNTER — Telehealth: Payer: Self-pay | Admitting: Family Medicine

## 2014-03-24 NOTE — Telephone Encounter (Signed)
-----   Message from Mancel Bale, PA-C sent at 03/21/2014  9:34 AM EST ----- Recheck 6 months - appt with me  Kristine Lee  ----- Message -----    From: Thomasenia Sales, CMA    Sent: 03/21/2014   8:48 AM      To: Mancel Bale, PA-C  There are no notes for this patient? Not sure what needs to be done? Thanks! ----- Message -----    From: Mancel Bale, PA-C    Sent: 02/22/2014  11:54 AM      To: Rhea Pink Scheduling Pool

## 2014-03-24 NOTE — Telephone Encounter (Signed)
LMOM to CB. 

## 2014-04-01 NOTE — Progress Notes (Signed)
Appointment scheduled.

## 2014-04-01 NOTE — Telephone Encounter (Signed)
-----   Message from Mancel Bale, PA-C sent at 03/21/2014  9:34 AM EST ----- Recheck 6 months - appt with me  Sarah  ----- Message -----    From: Thomasenia Sales, CMA    Sent: 03/21/2014   8:48 AM      To: Mancel Bale, PA-C  There are no notes for this patient? Not sure what needs to be done? Thanks! ----- Message -----    From: Mancel Bale, PA-C    Sent: 02/22/2014  11:54 AM      To: Rhea Pink Scheduling Pool

## 2014-04-02 ENCOUNTER — Other Ambulatory Visit: Payer: Self-pay | Admitting: Physician Assistant

## 2014-05-14 ENCOUNTER — Ambulatory Visit (INDEPENDENT_AMBULATORY_CARE_PROVIDER_SITE_OTHER): Payer: 59 | Admitting: Family Medicine

## 2014-05-14 ENCOUNTER — Telehealth: Payer: Self-pay | Admitting: *Deleted

## 2014-05-14 VITALS — BP 142/92 | HR 70 | Temp 97.7°F

## 2014-05-14 DIAGNOSIS — R42 Dizziness and giddiness: Secondary | ICD-10-CM

## 2014-05-14 DIAGNOSIS — R079 Chest pain, unspecified: Secondary | ICD-10-CM

## 2014-05-14 MED ORDER — MECLIZINE HCL 25 MG PO TABS: 25.0000 mg | ORAL_TABLET | Freq: Three times a day (TID) | ORAL | Status: DC | PRN

## 2014-05-14 MED ORDER — MECLIZINE HCL 32 MG PO TABS: 32.0000 mg | ORAL_TABLET | Freq: Three times a day (TID) | ORAL | Status: DC | PRN

## 2014-05-14 MED ORDER — MECLIZINE HCL 32 MG PO TABS: 25.0000 mg | ORAL_TABLET | Freq: Three times a day (TID) | ORAL | Status: DC | PRN

## 2014-05-14 NOTE — Patient Instructions (Addendum)
Your symptoms are most likely due to bppv (below) Please take the meclizine up to three times daily as needed for the dizziness. Be sure to move slowly and not do any sudden head movements.  Your EKG was normal today.  The meclizine should make you feel better within 24 hours, if the symptoms are still present tomorrow please return to clinic. It is common to have some GI upset, nausea, and reflux when you have dizzy spells like this. If you have another episodes of chest pain like you did here please take maalox or mylanta to see if that helps. If it doesn't please return here, go to the ED, or call 911.  It is also common for your blood pressure to elevate when you have these symptoms. It is not at a worrisome level today.    Benign Positional Vertigo Vertigo means you feel like you or your surroundings are moving when they are not. Benign positional vertigo is the most common form of vertigo. Benign means that the cause of your condition is not serious. Benign positional vertigo is more common in older adults. CAUSES  Benign positional vertigo is the result of an upset in the labyrinth system. This is an area in the middle ear that helps control your balance. This may be caused by a viral infection, head injury, or repetitive motion. However, often no specific cause is found. SYMPTOMS  Symptoms of benign positional vertigo occur when you move your head or eyes in different directions. Some of the symptoms may include:  Loss of balance and falls.  Vomiting.  Blurred vision.  Dizziness.  Nausea.  Involuntary eye movements (nystagmus). DIAGNOSIS  Benign positional vertigo is usually diagnosed by physical exam. If the specific cause of your benign positional vertigo is unknown, your caregiver may perform imaging tests, such as magnetic resonance imaging (MRI) or computed tomography (CT). TREATMENT  Your caregiver may recommend movements or procedures to correct the benign positional  vertigo. Medicines such as meclizine, benzodiazepines, and medicines for nausea may be used to treat your symptoms. In rare cases, if your symptoms are caused by certain conditions that affect the inner ear, you may need surgery. HOME CARE INSTRUCTIONS   Follow your caregiver's instructions.  Move slowly. Do not make sudden body or head movements.  Avoid driving.  Avoid operating heavy machinery.  Avoid performing any tasks that would be dangerous to you or others during a vertigo episode.  Drink enough fluids to keep your urine clear or pale yellow. SEEK IMMEDIATE MEDICAL CARE IF:   You develop problems with walking, weakness, numbness, or using your arms, hands, or legs.  You have difficulty speaking.  You develop severe headaches.  Your nausea or vomiting continues or gets worse.  You develop visual changes.  Your family or friends notice any behavioral changes.  Your condition gets worse.  You have a fever.  You develop a stiff neck or sensitivity to light. MAKE SURE YOU:   Understand these instructions.  Will watch your condition.  Will get help right away if you are not doing well or get worse. Document Released: 02/07/2006 Document Revised: 07/25/2011 Document Reviewed: 01/20/2011 [facility] Patient Information 2015 [place], [place]. This information is not intended to replace advice given to you by your health care provider. Make sure you discuss any questions you have with your health care provider.

## 2014-05-14 NOTE — Telephone Encounter (Signed)
[facility]m. Script was changed to 267mper Chel[facility].

## 2014-05-14 NOTE — Progress Notes (Signed)
Subjective:    Patient ID: Kristine Lee, female    DOB: [birth year], 43 y.o.   MRN: [id number]  PCP: Lujean Amel, MD  Chief Complaint  Patient presents with  . blood pressure check   Patient Active Problem List   Diagnosis Date Noted  . Depression 04/18/2013   Prior to Admission medications   Medication Sig Start Date End Date Taking? Authorizing Provider  FLUoxetine (PROZAC) 20 MG tablet TAKE 1 TABLET (20 MG TOTAL) BY MOUTH DAILY. 04/02/14  Yes Chelle S Jeffery, PA-C  fluticasone (FLONASE) 50 MCG/ACT nasal spray Place 2 sprays into both nostrils daily. 02/22/14  Yes Mancel Bale, PA-C  loratadine (CLARITIN) 10 MG tablet Take 1 tablet (10 mg total) by mouth daily. 07/17/13  Yes Mancel Bale, PA-C  omeprazole (PRILOSEC) 40 MG capsule TAKE ONE CAPSULE BY MOUTH EVERY DAY 02/22/14  Yes Mancel Bale, PA-C  ALPRAZolam Duanne Moron) 0.25 MG tablet Take 0.5-1 tablets (0.125-0.25 mg total) by mouth 2 (two) times daily as needed. Patient not taking: Reported on 05/14/2014 02/22/14   Mancel Bale, PA-C  meclizine (ANTIVERT) 32 MG tablet Take 1 tablet (32 mg total) by mouth 3 (three) times daily as needed. 05/14/14   Araceli Bouche, PA   Medications, allergies, past medical history, surgical history, family history, social history and problem list reviewed and updated.  HPI  43 yof with pmh depression presents with dizziness.  This am she awoke suddenly at 2am feeling dizzy. When she rolled over the dizziness worsenend. She stood up to use the restroom and felt both dizzy and that the room was spinning. This persisted for few mins then she layed back down and was able to go to sleep. She awoke couple hrs later and got ready for work. Felt slightly dizzy throughout getting ready. Drove to work and continued to feel dizzy. She would feel that room was spinning when she was in certain positions. Had some assoc nausea. No cp, sob, palps, ha, vomiting, syncope assoc these sx.   She left work to go to  PT this am for right shoulder and still felt dizzy there. BP was 140/100 so they told her to go to urgent care. While seated in waiting room here she felt both dizzy and that the room was spinning. She then had episode sudden onset sscp rated 1/10 while seated. When she was brought back and layed down for ekg this worsened to 4/10. Eventually resolved on its own. Did radiate to right shoulder/arm for few mins. No radiation through to back. No assoc sob, palps. Has not had any exertional cp.   Denies recent leg pain or swelling. No ocp. No recent immobilization.   Denies cough, fever, chills, otalgia, neck pain, vision changes, double vision.   BP 142/92 in clinic today which is elevated for her.   Review of Systems See HPI.     Objective:   Physical Exam  Constitutional: She is oriented to person, place, and time. She appears well-developed and well-nourished.  Non-toxic appearance. She does not have a sickly appearance. She does not appear ill. No distress.  BP 142/92 mmHg  Pulse 70  Temp(Src) 97.7 F (36.5 C) (Oral)  SpO2 100%   HENT:  Right Ear: Tympanic membrane normal.  Left Ear: Tympanic membrane normal.  Eyes: Conjunctivae and EOM are normal. Pupils are equal, round, and reactive to light.  Neck: Normal carotid pulses present. Carotid bruit is not present. No Brudzinski's sign noted.  Cardiovascular: Normal rate, regular  rhythm and normal heart sounds.  Exam reveals no gallop.   No murmur heard. Pulmonary/Chest: Effort normal and breath sounds normal. No tachypnea. She has no decreased breath sounds. She has no wheezes. She has no rhonchi. She has no rales. She exhibits no tenderness and no bony tenderness.  Lymphadenopathy:       Head (right side): No submental, no submandibular and no tonsillar adenopathy present.       Head (left side): No submental, no submandibular and no tonsillar adenopathy present.  Neurological: She is alert and oriented to person, place, and time. She  has normal strength. No cranial nerve deficit or sensory deficit. Coordination abnormal.  Negative heel to shin, negative rapid alternating movements. Positive romberg. Negative Dix-Halpike.   Psychiatric: She has a normal mood and affect. Her speech is normal.   EKG read by Dr Joseph Art. Findings: Normal     Assessment & Plan:   Dizzy - Plan: EKG 12-Lead, meclizine (ANTIVERT) 32 MG tablet --benign neuro exam other than positive romberg --most likely bppv, meclizine tid  --rtc if not improved 24 hrs --no assoc palps, normal ekg today --no carotid bruits on exam --no dm, able to eat yest, doubt low bg  Chest pain, unspecified chest pain type --one episode while seated in clinic --doubt cardiac with normal ekg, no exertional cp hx, no family hx early cad --maalox/mylanta if returns at home as could be due to vagal/reflux response with dizzy spells --if another spell of cp doesn't resolve with antacid, rtc/er/911  Julieta Gutting, PA-C Physician Assistant-Certified [facility]  05/14/2014 12:19 PM

## 2014-05-19 ENCOUNTER — Telehealth: Payer: Self-pay

## 2014-05-19 NOTE — Telephone Encounter (Signed)
PT CALLED AND WANTS TO TALK WITH SOMEONE ABOUT "COMING OFF MEDICINE"  SHE ALSO WANTS TO SEE AN ENT AND WANTS A RECOMMENDATION OF AN ENT PROVIDER

## 2014-05-19 NOTE — Telephone Encounter (Signed)
Please advise regimen for weaning off of Prozac. Pt no longer wants to take this medication. She is seeing a Teacher, early years/pre and does not like to be on medication.  She is on tablets and has the capability of cutting.   Pt does not need a referral to an ENT she was wondering who we usually send patients to. Advised her [facility] ENT.

## 2014-05-20 NOTE — Telephone Encounter (Signed)
I really think that having the patient come in to talk with me would be the best.  It takes along time to get off Prozac and we want to make absolutely sure she is ready to be off it this time.

## 2014-05-21 NOTE — Telephone Encounter (Signed)
Pt notified and will RTC.

## 2014-05-31 ENCOUNTER — Ambulatory Visit (INDEPENDENT_AMBULATORY_CARE_PROVIDER_SITE_OTHER): Payer: 59 | Admitting: Physician Assistant

## 2014-05-31 VITALS — BP 106/68 | HR 66 | Temp 98.2°F | Resp 16 | Ht 64.0 in | Wt 142.0 lb

## 2014-05-31 DIAGNOSIS — F329 Major depressive disorder, single episode, unspecified: Secondary | ICD-10-CM

## 2014-05-31 DIAGNOSIS — L989 Disorder of the skin and subcutaneous tissue, unspecified: Secondary | ICD-10-CM

## 2014-05-31 DIAGNOSIS — F32A Depression, unspecified: Secondary | ICD-10-CM

## 2014-05-31 MED ORDER — FLUOXETINE HCL 10 MG PO CAPS: ORAL_CAPSULE | ORAL | Status: DC

## 2014-05-31 NOTE — Progress Notes (Signed)
   Subjective:    Patient ID: Kristine Lee, female    DOB: 03/06/1971, 44 y.o.   MRN: [id number]  HPI Pt presents to clinic with interest in going off her Prozac.  She has been on it greater than a year and really feels that she is is a different place from when the depression started after the end of a long term relationship.  She is currently in counseling and plans to continue that.  Her current partner, Lattie Haw, is very supportive and aware of her depression and is on board with being aware of if her symptoms return to help for recognize it before it gets to bad.  She is sleeping well.  She is in a new position at work where she is more alone which has been good for her.  She still has some Xanax that she uses rarely and she knows that she has it in bad situations.  She had some vertigo and the muscle relaxers seem to help the most to prevent her vertigo, but the antivert was helping with her symptoms.  She has no tinnitus or hearing loss.  She was told she had some wax in her ear on the right but she has been using wax removal and wants me to check that today.  She has a lesion on her right cheek that she would like looked at - it seems to be getting bigger but it is not bothering her other than it is there  Currently seeing Dr Onnie Graham for neck problems and in PT. Currently seeing Dr Benson Norway for IBS - using IBgard which is mainly peppermint oil and she has noticed since starting this her diarrhea is resolved.  Review of Systems  Constitutional: Negative.   HENT: Negative.   Psychiatric/Behavioral: Negative for dysphoric mood. The patient is not nervous/anxious.        Objective:   Physical Exam  Constitutional: She appears well-developed and well-nourished.  BP 106/68 mmHg  Pulse 66  Temp(Src) 98.2 F (36.8 C)  Resp 16  Ht 5' 4"  (1.626 m)  Wt 142 lb (64.411 kg)  BMI 24.36 kg/m2  SpO2 99%   HENT:  Head: Normocephalic and atraumatic.  Right Ear: Hearing, tympanic membrane, external  ear and ear canal normal.  Left Ear: Hearing, tympanic membrane, external ear and ear canal normal.  Pulmonary/Chest: Effort normal.  Skin: Skin is warm and dry.  Lesion on her right cheek - small in size about 28m - erythematous and shiny without dryness  Psychiatric: She has a normal mood and affect. Her behavior is normal. Judgment and thought content normal.       Assessment & Plan:  Depression - she would like to decrease her medication and I think that she is stable and has a support system present - we will taper over 6 week which will allow her time to make sure she does not need it - Plan: FLUoxetine (PROZAC) 10 MG capsule  Skin lesion of cheek - Plan: Ambulatory referral to Dermatology -?hemangioma vs basal cell?  Vertigo - ?related to her muscle tightness in her neck - considering the Flexeril has made it resolve - she plan to continue PT and the flexeril and she has the antivert if she needs it  SWindell HummingbirdPA-C  [facility] 05/31/2014 9:57 AM

## 2014-06-21 ENCOUNTER — Telehealth: Payer: Self-pay

## 2014-06-21 NOTE — Telephone Encounter (Signed)
Pt called stating Kristine Lee said she would give her an authorization [facility] [address] [facility] medication cyclobenzaprine (FLEXERIL) 5 MG tablet [[id number]]. Please advise at [phone]

## 2014-06-23 NOTE — Telephone Encounter (Signed)
I think the Rx pool is backed up. Can you help with this?

## 2014-06-24 MED ORDER — CYCLOBENZAPRINE HCL 5 MG PO TABS: 5.0000 mg | ORAL_TABLET | Freq: Every day | ORAL | Status: DC

## 2014-06-24 NOTE — Telephone Encounter (Signed)
I have done this for the patient

## 2014-06-25 ENCOUNTER — Other Ambulatory Visit: Payer: Self-pay | Admitting: Physician Assistant

## 2014-08-01 ENCOUNTER — Encounter: Payer: Self-pay | Admitting: *Deleted

## 2014-08-12 ENCOUNTER — Emergency Department (HOSPITAL_COMMUNITY)
Admission: EM | Admit: 2014-08-12 | Discharge: 2014-08-12 | Disposition: A | Discharge status: Home / Self Care | Payer: 59 | Attending: Emergency Medicine | Admitting: Emergency Medicine

## 2014-08-12 ENCOUNTER — Ambulatory Visit (HOSPITAL_COMMUNITY): Payer: 59

## 2014-08-12 ENCOUNTER — Encounter (HOSPITAL_COMMUNITY): Payer: Self-pay | Admitting: *Deleted

## 2014-08-12 DIAGNOSIS — R1110 Vomiting, unspecified: Secondary | ICD-10-CM | POA: Insufficient documentation

## 2014-08-12 DIAGNOSIS — Z79899 Other long term (current) drug therapy: Secondary | ICD-10-CM | POA: Diagnosis not present

## 2014-08-12 DIAGNOSIS — Z8739 Personal history of other diseases of the musculoskeletal system and connective tissue: Secondary | ICD-10-CM | POA: Insufficient documentation

## 2014-08-12 DIAGNOSIS — F419 Anxiety disorder, unspecified: Secondary | ICD-10-CM | POA: Insufficient documentation

## 2014-08-12 DIAGNOSIS — Z8639 Personal history of other endocrine, nutritional and metabolic disease: Secondary | ICD-10-CM | POA: Diagnosis not present

## 2014-08-12 DIAGNOSIS — R1012 Left upper quadrant pain: Secondary | ICD-10-CM | POA: Diagnosis not present

## 2014-08-12 DIAGNOSIS — K219 Gastro-esophageal reflux disease without esophagitis: Secondary | ICD-10-CM | POA: Insufficient documentation

## 2014-08-12 DIAGNOSIS — Z3202 Encounter for pregnancy test, result negative: Secondary | ICD-10-CM | POA: Diagnosis not present

## 2014-08-12 LAB — CBC WITH DIFFERENTIAL/PLATELET
BASOS ABS: 0 10*3/uL (ref 0.0–0.1)
Basophils Relative: 0 % (ref 0–1)
EOS ABS: 0.1 10*3/uL (ref 0.0–0.7)
Eosinophils Relative: 1 % (ref 0–5)
HCT: 43.7 % (ref 36.0–46.0)
Hemoglobin: 14.6 g/dL (ref 12.0–15.0)
LYMPHS PCT: 23 % (ref 12–46)
Lymphs Abs: 1.8 10*3/uL (ref 0.7–4.0)
MCH: 29.4 pg (ref 26.0–34.0)
MCHC: 33.4 g/dL (ref 30.0–36.0)
MCV: 88.1 fL (ref 78.0–100.0)
Monocytes Absolute: 0.6 10*3/uL (ref 0.1–1.0)
Monocytes Relative: 8 % (ref 3–12)
NEUTROS ABS: 5.3 10*3/uL (ref 1.7–7.7)
Neutrophils Relative %: 68 % (ref 43–77)
Platelets: 247 10*3/uL (ref 150–400)
RBC: 4.96 MIL/uL (ref 3.87–5.11)
RDW: 13.8 % (ref 11.5–15.5)
WBC: 7.8 10*3/uL (ref 4.0–10.5)

## 2014-08-12 LAB — COMPREHENSIVE METABOLIC PANEL
ALT: 58 U/L — AB (ref 0–35)
ANION GAP: 9 (ref 5–15)
AST: 66 U/L — AB (ref 0–37)
Albumin: 4.6 g/dL (ref 3.5–5.2)
Alkaline Phosphatase: 91 U/L (ref 39–117)
BUN: 15 mg/dL (ref 6–23)
CHLORIDE: 103 mmol/L (ref 96–112)
CO2: 24 mmol/L (ref 19–32)
CREATININE: 0.67 mg/dL (ref 0.50–1.10)
Calcium: 9.2 mg/dL (ref 8.4–10.5)
GFR calc non Af Amer: >90 mL/min (ref >90)
Glucose, Bld: 97 mg/dL (ref 70–99)
Potassium: 3.8 mmol/L (ref 3.5–5.1)
Sodium: 136 mmol/L (ref 135–145)
Total Bilirubin: 0.4 mg/dL (ref 0.3–1.2)
Total Protein: 8.5 g/dL — ABNORMAL HIGH (ref 6.0–8.3)

## 2014-08-12 LAB — URINALYSIS, ROUTINE W REFLEX MICROSCOPIC
Bilirubin Urine: NEGATIVE
Glucose, UA: NEGATIVE mg/dL
Hgb urine dipstick: NEGATIVE
KETONES UR: NEGATIVE mg/dL
LEUKOCYTES UA: NEGATIVE
NITRITE: NEGATIVE
Protein, ur: NEGATIVE mg/dL
Specific Gravity, Urine: 1.005 (ref 1.005–1.030)
UROBILINOGEN UA: 0.2 mg/dL (ref 0.0–1.0)
pH: 6.5 (ref 5.0–8.0)

## 2014-08-12 LAB — LIPASE, BLOOD: Lipase: 196 U/L — ABNORMAL HIGH (ref 11–59)

## 2014-08-12 LAB — I-STAT TROPONIN, ED: Troponin i, poc: 0 ng/mL (ref 0.00–0.08)

## 2014-08-12 LAB — PREGNANCY, URINE: Preg Test, Ur: NEGATIVE

## 2014-08-12 MED ORDER — IOHEXOL 300 MG/ML  SOLN
50.0000 mL | Freq: Once | INTRAMUSCULAR | Status: AC | PRN
  Administered 2014-08-12: 50 mL via ORAL

## 2014-08-12 MED ORDER — ONDANSETRON 8 MG PO TBDP
8.0000 mg | ORAL_TABLET | Freq: Once | ORAL | Status: DC
  Filled 2014-08-12: qty 1

## 2014-08-12 MED ORDER — IOHEXOL 300 MG/ML  SOLN
100.0000 mL | Freq: Once | INTRAMUSCULAR | Status: AC | PRN
  Administered 2014-08-12: 100 mL via INTRAVENOUS

## 2014-08-12 NOTE — ED Provider Notes (Signed)
CSN: [id number]     Arrival date & time 08/12/14  0857 History   First MD Initiated Contact with Patient 08/12/14 [phone]     Chief Complaint  Patient presents with  . Abdominal Pain  . Emesis    Patient is a 44 y.o. female presenting with abdominal pain and vomiting. The history is provided by the patient (friend at bedside, pt gives permission for her to be present).  Abdominal Pain Pain location:  LUQ Pain quality: cramping   Pain radiates to:  L flank Pain severity:  Severe Onset quality:  Sudden Duration:  1 hour Timing:  Intermittent Progression:  Improving Chronicity:  Recurrent Relieved by:  Nothing Worsened by:  Nothing tried Associated symptoms: vomiting   Associated symptoms: no chest pain, no constipation, no diarrhea, no dysuria, no fever and no vaginal bleeding   Emesis Associated symptoms: abdominal pain   Associated symptoms: no diarrhea   pt has had episodes of LUQ pain for one year They usually last several minutes then resolve This episode started this morning and lasted over an hour Now improved She had 4 episodes of nonbloody emesis She has been seen by GI and is currently undergoing workup  Past Medical History  Diagnosis Date  . Allergy   . Arthritis   . GERD (gastroesophageal reflux disease)   . Hyperlipidemia   . Anxiety    Past Surgical History  Procedure Laterality Date  . Knee surgery    . Feet surgery    . Tonsillectomy    . Eye surgery    . Fracture surgery     Family History  Problem Relation Age of Onset  . Heart disease Father   . Diabetes Sister   . Depression Sister     current on Prozac  . Diabetes Sister   . Depression Mother     currentlty on Prozac  . Diabetes Mother    History  Substance Use Topics  . Smoking status: Never Smoker   . Smokeless tobacco: Not on file  . Alcohol Use: No   OB History    No data available     Review of Systems  Constitutional: Negative for fever.  Cardiovascular: Negative for chest  pain.  Gastrointestinal: Positive for vomiting and abdominal pain. Negative for diarrhea and constipation.  Genitourinary: Negative for dysuria and vaginal bleeding.  All other systems reviewed and are negative.     Allergies  Biaxin and Vancomycin  Home Medications   Prior to Admission medications   Medication Sig Start Date End Date Taking? Authorizing Provider  ALPRAZolam (XANAX) 0.25 MG tablet Take 0.5-1 tablets (0.125-0.25 mg total) by mouth 2 (two) times daily as needed. Patient taking differently: Take 0.125-0.25 mg by mouth 2 (two) times daily as needed for anxiety.  02/22/14  Yes Mancel Bale, PA-C  cetirizine (ZYRTEC) 10 MG tablet Take 10 mg by mouth daily.   Yes Historical Provider, MD  Eluxadoline 100 MG TABS Take 1 tablet by mouth daily. 08/12/14  Yes Historical Provider, MD  omeprazole (PRILOSEC) 40 MG capsule TAKE ONE CAPSULE BY MOUTH EVERY DAY 02/22/14  Yes Mancel Bale, PA-C  peppermint oil liquid Take 90 mg by mouth daily. *IBgard*   Yes Historical Provider, MD  cyclobenzaprine (FLEXERIL) 5 MG tablet Take 1 tablet (5 mg total) by mouth at bedtime. Patient not taking: Reported on 08/12/2014 06/24/14   Mancel Bale, PA-C  FLUoxetine (PROZAC) 10 MG capsule 1 po qd for 3 weeks and then 1  po qod for 3 weeks Patient not taking: Reported on 08/12/2014 05/31/14   Mancel Bale, PA-C  fluticasone [facility]) 50 MCG/ACT nasal spray Place 2 sprays into both nostrils daily. Patient not taking: Reported on 08/12/2014 02/22/14   Mancel Bale, PA-C  loratadine (CLARITIN) 10 MG tablet Take 1 tablet (10 mg total) by mouth daily. Patient not taking: Reported on 08/12/2014 07/17/13   Mancel Bale, PA-C  meclizine (ANTIVERT) 25 MG tablet Take 1 tablet (25 mg total) by mouth 3 (three) times daily as needed. Patient not taking: Reported on 08/12/2014 05/14/14   Chelle S Jeffery, PA-C   BP 126/88 mmHg  Pulse 73  Temp(Src) 97.9 F (36.6 C) (Oral)  Resp 16  SpO2 100%  LMP 06/30/2014 (Exact  Date) Physical Exam CONSTITUTIONAL: Well developed/well nourished HEAD: Normocephalic/atraumatic EYES: EOMI/PERRL ENMT: Mucous membranes moist NECK: supple no meningeal signs SPINE/BACK:entire spine nontender CV: S1/S2 noted, no murmurs/rubs/gallops noted LUNGS: Lungs are clear to auscultation bilaterally, no apparent distress ABDOMEN: soft, mild LUQ tenderness, no rebound or guarding, bowel sounds noted throughout abdomen GU:no cva tenderness NEURO: Pt is awake/alert/appropriate, moves all extremitiesx4.  No facial droop.   EXTREMITIES: pulses normal/equal, full ROM SKIN: warm, color normal PSYCH: no abnormalities of mood noted, alert and oriented to situation  ED Course  Procedures   10:58 AM Pt with mild tenderness. She has abnormal labs (reports they are usually normal) and she is requesting CT imaging She declines pain meds 12:27 PM Pt improved No vomiting Afebrile Most of her pain was located in LUQ and no focal RUQ tenderness - CT shows ?GB inflammation but clinicaly does not appear to have cholecystitis.  I feel she is safe for d/c home and f/u with GI and can have outpatient [place] We discussed strict return precautions  Labs Review Labs Reviewed  COMPREHENSIVE METABOLIC PANEL - Abnormal; Notable for the following:    Total Protein 8.5 (*)    AST 66 (*)    ALT 58 (*)    All other components within normal limits  LIPASE, BLOOD - Abnormal; Notable for the following:    Lipase 196 (*)    All other components within normal limits  CBC WITH DIFFERENTIAL/PLATELET  URINALYSIS, ROUTINE W REFLEX MICROSCOPIC  PREGNANCY, URINE  I-STAT TROPOININ, ED    Imaging Review Ct Abdomen Pelvis W Contrast  08/12/2014   CLINICAL DATA:  44 year old female with intermittent left upper quadrant pain, vomiting. Initial encounter.  EXAM: CT ABDOMEN AND PELVIS WITH CONTRAST  TECHNIQUE: Multidetector CT imaging of the abdomen and pelvis was performed using the standard protocol following bolus  administration of intravenous contrast.  CONTRAST:  166m OMNIPAQUE IOHEXOL 300 MG/ML  SOLN  COMPARISON:  None.  FINDINGS: Negative lung bases. No pericardial or pleural effusion. No acute osseous abnormality identified.  Small volume pelvic free fluid, likely physiologic. 4 cm right adnexal low-density area with simple fluid densitometry (simple cyst up to 5 cm do not require follow-up an premenopausal patients). Negative uterus and adnexa otherwise.  Negative bladder.  Decompressed distal colon.  Retained stool throughout the left colon and transverse colon which otherwise appear normal. Normal right colon aside from retained stool. Normal appendix. Negative terminal ileum. No dilated small bowel. Negative stomach and duodenum.  Liver, spleen, pancreas and adrenal glands are within normal limits. The portal venous system is patent.  There is mild indistinctness of the gallbladder wall (series 2, image 27 and coronal image 32). Intra and extrahepatic biliary tree appears within normal  limits. No abdominal free fluid.  Bilateral renal enhancement and contrast excretion within normal limits. No lymphadenopathy. Major arterial structures are patent. Minimal distal aortic calcified plaque.  IMPRESSION: 1. Indistinct appearance of the gallbladder wall suspicious for acute cholecystitis. Recommend a followup right upper quadrant ultrasound to evaluate further. 2. Otherwise negative CT Abdomen and Pelvis.   Electronically Signed   By: Genevie Ann M.D.   On: 08/12/2014 12:06     EKG Interpretation   Date/Time:  Tuesday August 12 2014 09:07:51 EDT Ventricular Rate:  64 PR Interval:  200 QRS Duration: 88 QT Interval:  397 QTC Calculation: 410 R Axis:   45 Text Interpretation:  Sinus rhythm Non-specific ST-t changes No previous  ECGs available Confirmed by Christy Gentles  MD, Elenore Rota ([id number]) on 08/12/2014  9:11:43 AM      MDM   Final diagnoses:  Left upper quadrant pain    Nursing notes including past medical  history and social history reviewed and considered in documentation Labs/vital reviewed myself and considered during evaluation     Ripley Fraise, MD 08/12/14 1228

## 2014-08-12 NOTE — ED Notes (Signed)
Pt reports intermittent LUQ pain x1 year, usually has once episode per month. Pt sees GI Dr Benson Norway. Has had blood work and colonoscopy with normal results. Next plan is to get CT. Pt reports LUQ pain episode started around 845, worst episode to date. Pt vomited x4, which is not normal. Pain 6/10, 10/10 with spasms. Pt has been on anti spasms meds, GI doctor changed her to viberzi this week. Pt reports pain decreases with pressure to side.

## 2014-08-12 NOTE — Discharge Instructions (Signed)
Abdominal (belly) pain can be caused by many things. any cases can be observed and treated at home after initial evaluation in the emergency department. Even though you are being discharged home, abdominal pain can be unpredictable. Therefore, you need a repeated exam if your pain does not resolve, returns, or worsens. Most patients with abdominal pain don't have to be admitted to the hospital or have surgery, but serious problems like appendicitis and gallbladder attacks can start out as nonspecific pain. Many abdominal conditions cannot be diagnosed in one visit, so follow-up evaluations are very important. SEEK IMMEDIATE MEDICAL ATTENTION IF: The pain does not go away or becomes severe, particularly over the next 8-12 hours.  A temperature above 100.39F develops.  Repeated vomiting occurs (multiple episodes).  The pain becomes localized to portions of the abdomen. The right side could possibly be appendicitis. In an adult, the left lower portion of the abdomen could be colitis or diverticulitis.  Blood is being passed in stools or vomit (bright red or black tarry stools).  Return also if you develop chest pain, difficulty breathing, dizziness or fainting, or become confused, poorly responsive, or inconsolable.

## 2014-08-13 ENCOUNTER — Other Ambulatory Visit: Payer: Self-pay | Admitting: Gastroenterology

## 2014-08-13 DIAGNOSIS — R1012 Left upper quadrant pain: Secondary | ICD-10-CM

## 2014-08-15 HISTORY — PX: OTHER SURGICAL HISTORY: SHX169

## 2014-08-21 ENCOUNTER — Ambulatory Visit (HOSPITAL_COMMUNITY)
Admission: RE | Admit: 2014-08-21 | Discharge: 2014-08-21 | Disposition: A | Discharge status: Home / Self Care | Payer: 59 | Source: Ambulatory Visit | Attending: Gastroenterology | Admitting: Gastroenterology

## 2014-08-21 DIAGNOSIS — R1012 Left upper quadrant pain: Secondary | ICD-10-CM | POA: Insufficient documentation

## 2014-08-21 DIAGNOSIS — K8020 Calculus of gallbladder without cholecystitis without obstruction: Secondary | ICD-10-CM | POA: Diagnosis not present

## 2014-08-25 ENCOUNTER — Telehealth: Payer: Self-pay

## 2014-08-25 DIAGNOSIS — F329 Major depressive disorder, single episode, unspecified: Secondary | ICD-10-CM

## 2014-08-25 DIAGNOSIS — F32A Depression, unspecified: Secondary | ICD-10-CM

## 2014-08-25 MED ORDER — FLUOXETINE HCL 10 MG PO CAPS: 10.0000 mg | ORAL_CAPSULE | Freq: Every day | ORAL | Status: DC

## 2014-08-25 NOTE — Telephone Encounter (Signed)
Patient was off of Prozac and started back two days ago. Patient wants to know if it's okay to take it. She would like a prescription sent to [facility] on [address] and to know if she needs to come in and be seen before her next scheduled appointment in order to get a refill. Patient sees Bernestine Amass. Patient phone: [phone]

## 2014-08-25 NOTE — Telephone Encounter (Signed)
Please advise 

## 2014-08-25 NOTE — Telephone Encounter (Signed)
I've sent the Prozac to the pharmacy.  Her visit with Judson Roch on 5/18 is perfect for follow-up. She can certainly come in sooner if needed.  Meds ordered this encounter  Medications  . FLUoxetine (PROZAC) 10 MG capsule    Sig: Take 1 capsule (10 mg total) by mouth daily.    Dispense:  90 capsule    Refill:  0    Order Specific Question:  Supervising Provider    Answer:  DOOLITTLE, ROBERT P [[id number]]

## 2014-08-25 NOTE — Telephone Encounter (Signed)
Pt.notified

## 2014-09-10 ENCOUNTER — Other Ambulatory Visit: Payer: Self-pay | Admitting: General Surgery

## 2014-09-14 HISTORY — PX: CHOLECYSTECTOMY, LAPAROSCOPIC: SHX56

## 2014-10-01 ENCOUNTER — Ambulatory Visit (INDEPENDENT_AMBULATORY_CARE_PROVIDER_SITE_OTHER): Payer: 59 | Admitting: Physician Assistant

## 2014-10-01 ENCOUNTER — Encounter: Payer: Self-pay | Admitting: Physician Assistant

## 2014-10-01 VITALS — BP 116/76 | HR 73 | Temp 97.5°F | Resp 16 | Ht 65.0 in | Wt 151.3 lb

## 2014-10-01 DIAGNOSIS — F418 Other specified anxiety disorders: Secondary | ICD-10-CM

## 2014-10-01 DIAGNOSIS — F329 Major depressive disorder, single episode, unspecified: Secondary | ICD-10-CM

## 2014-10-01 DIAGNOSIS — F419 Anxiety disorder, unspecified: Principal | ICD-10-CM

## 2014-10-01 DIAGNOSIS — F32A Depression, unspecified: Secondary | ICD-10-CM

## 2014-10-01 MED ORDER — FLUOXETINE HCL 20 MG PO TABS
20.0000 mg | ORAL_TABLET | Freq: Every day | ORAL | Status: DC
Start: 2014-10-01 — End: 2014-11-19

## 2014-10-01 NOTE — Progress Notes (Signed)
Subjective:    Patient ID: Kristine Lee, female    DOB: 04/29/1971, 44 y.o.   MRN: [id number]  HPI Pt presents to clinic for a recheck.  She has had 2 surgeries in the last 4 weeks.  They finally decided that her headaches and dizziness were coming from her shoulder and resulting muscle spasms from impingement and a full rotator cuff tear.  Last week she had gallbladder surgery.  She is feeling ok.  She tried to go off her Prozac and did for about 2 months but then she noticed that her irritability and her lack of control feelings got really bad.  Her partner pointed it out to her and she went back on the medications.  She has been taking Prozac 73m and feels better.  She really does not like being on medication esp because she gets sexual side effects but she knows that she is a better person on the medication.  She rarely takes her Xanax and she has some left over.  She is still going through counseling and feels like it is helping.  Review of Systems  Patient Active Problem List   Diagnosis Date Noted  . Depression 04/18/2013   Prior to Admission medications   Medication Sig Start Date End Date Taking? Authorizing Provider  acetaminophen (TYLENOL) 500 MG tablet Take 500 mg by mouth every 6 (six) hours as needed.   Yes Historical Provider, MD  ALPRAZolam (XANAX) 0.25 MG tablet Take 0.5-1 tablets (0.125-0.25 mg total) by mouth 2 (two) times daily as needed. Patient taking differently: Take 0.125-0.25 mg by mouth 2 (two) times daily as needed for anxiety.  02/22/14  Yes SMancel Bale PA-C  cetirizine (ZYRTEC) 10 MG tablet Take 10 mg by mouth daily.   Yes Historical Provider, MD  diazepam (VALIUM) 5 MG tablet Take 5 mg by mouth at bedtime.   Yes Historical Provider, MD  FLUoxetine (PROZAC) 10 MG capsule Take 1 capsule (10 mg total) by mouth daily. 08/25/14  Yes Chelle Jeffery, PA-C  fluticasone (FLONASE) 50 MCG/ACT nasal spray Place 2 sprays into both nostrils daily. 02/22/14  Yes SMancel Bale PA-C  naproxen (NAPROSYN) 500 MG tablet Take 500 mg by mouth 2 (two) times daily with a meal.   Yes Historical Provider, MD  omeprazole (PRILOSEC) 40 MG capsule TAKE ONE CAPSULE BY MOUTH EVERY DAY 02/22/14  Yes SMancel Bale PA-C  FLUoxetine (PROZAC) 20 MG tablet Take 1 tablet (20 mg total) by mouth daily. 10/01/14   SMancel Bale PA-C  peppermint oil liquid Take 90 mg by mouth daily. *IBgard*    Historical Provider, MD   Allergies  Allergen Reactions  . Biaxin [Clarithromycin] Nausea And Vomiting  . Vancomycin Other (See Comments)    RedMan Syndrome    Medications, allergies, past medical history, surgical history, family history, social history and problem list reviewed and updated.     Objective:   Physical Exam  Constitutional: She is oriented to person, place, and time. She appears well-developed and well-nourished.  BP 116/76 mmHg  Pulse 73  Temp(Src) 97.5 F (36.4 C) (Oral)  Resp 16  Ht 5' 5"  (1.651 m)  Wt 151 lb 4.8 oz (68.629 kg)  BMI 25.18 kg/m2  SpO2 98%  LMP 09/15/2014   HENT:  Head: Normocephalic and atraumatic.  Right Ear: External ear normal.  Left Ear: External ear normal.  Pulmonary/Chest: Effort normal.  Neurological: She is alert and oriented to person, place, and time.  Skin:  Skin is warm and dry.  Psychiatric: She has a normal mood and affect. Her behavior is normal. Judgment and thought content normal.       Assessment & Plan:  Depression/anxiety - continue current medication Prozac.  She will try to decrease the Dose to Prozac 19m daily to see if she still has her anxiety she will increase back to 236mbut we will do this to see if she can decrease her sexual side effects.  Continue counseling.  She will recheck in 6 months.  SaWindell HummingbirdA-C  [facility] 10/01/2014 2:01 PM

## 2014-11-03 ENCOUNTER — Encounter: Payer: Self-pay | Admitting: Physician Assistant

## 2014-11-18 ENCOUNTER — Other Ambulatory Visit: Payer: Self-pay | Admitting: Physician Assistant

## 2014-11-19 ENCOUNTER — Other Ambulatory Visit: Payer: Self-pay

## 2014-11-19 MED ORDER — FLUOXETINE HCL 20 MG PO TABS: 20.0000 mg | ORAL_TABLET | Freq: Every day | ORAL | Status: DC

## 2015-01-10 ENCOUNTER — Encounter: Payer: Self-pay | Admitting: Physician Assistant

## 2015-01-15 ENCOUNTER — Ambulatory Visit (INDEPENDENT_AMBULATORY_CARE_PROVIDER_SITE_OTHER): Payer: 59 | Admitting: Physician Assistant

## 2015-01-15 VITALS — BP 106/62 | HR 78 | Temp 98.4°F | Resp 14 | Ht 65.0 in | Wt 147.0 lb

## 2015-01-15 DIAGNOSIS — R7989 Other specified abnormal findings of blood chemistry: Secondary | ICD-10-CM

## 2015-01-15 DIAGNOSIS — F418 Other specified anxiety disorders: Secondary | ICD-10-CM | POA: Diagnosis not present

## 2015-01-15 DIAGNOSIS — R899 Unspecified abnormal finding in specimens from other organs, systems and tissues: Secondary | ICD-10-CM | POA: Diagnosis not present

## 2015-01-15 DIAGNOSIS — M2550 Pain in unspecified joint: Secondary | ICD-10-CM

## 2015-01-15 DIAGNOSIS — R7689 Other specified abnormal immunological findings in serum: Secondary | ICD-10-CM

## 2015-01-15 DIAGNOSIS — R894 Abnormal immunological findings in specimens from other organs, systems and tissues: Secondary | ICD-10-CM

## 2015-01-15 DIAGNOSIS — R945 Abnormal results of liver function studies: Secondary | ICD-10-CM

## 2015-01-15 DIAGNOSIS — R768 Other specified abnormal immunological findings in serum: Secondary | ICD-10-CM

## 2015-01-15 MED ORDER — ALPRAZOLAM 0.25 MG PO TABS: 0.1250 mg | ORAL_TABLET | Freq: Two times a day (BID) | ORAL | Status: DC | PRN

## 2015-01-15 NOTE — Progress Notes (Signed)
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  She has anxiety and now she is really worried about these possible results and how that happened.  She has donated before and not had problems.  She has had at least 2 needle sticks at work but all lab work following those were neg.  She had neg Heb C screening about a year ago as an exposure was closing out at its year mark.  Now that she has had these results she has started to be aware of generalized aches and pains and she has always wondered if she has ever had a tick borne illness because she is outside a lot and is "always pulling ticks off".  Her past PCP did not want to do the testing but she has always been worried.  She otherwise is doing really well.  Her anxiety has been up since getting these lab results and she has been using her Xanax again.  She is still on her prozac since restarting it.  Patient Active Problem List   Diagnosis Date Noted  . Anxiety and depression 04/18/2013    Current Outpatient Prescriptions on File Prior to Visit  Medication Sig Dispense Refill  . cetirizine (ZYRTEC) 10 MG tablet Take 10 mg by mouth daily.    Marland Kitchen FLUoxetine (PROZAC) 20 MG tablet Take 1 tablet (20 mg total) by mouth daily. 90 tablet 0  . fluticasone (FLONASE) 50 MCG/ACT nasal spray Place 2 sprays into both nostrils daily. 48 g 3  . naproxen (NAPROSYN) 500 MG tablet Take 500 mg by mouth 2 (two) times daily with a meal.    . omeprazole (PRILOSEC) 40 MG capsule TAKE ONE CAPSULE BY MOUTH EVERY DAY 90 capsule 3   No current facility-administered medications on file prior to visit.    Allergies  Allergen Reactions  . Biaxin [Clarithromycin] Nausea And Vomiting  . Vancomycin Other (See Comments)    RedMan Syndrome    Review of Systems    Gastrointestinal: Negative for abdominal pain.  Musculoskeletal: Positive for arthralgias (big and small joints - at different times - not associated with acitivity - though patient is really active).  Psychiatric/Behavioral: The patient is nervous/anxious.    Objective:  BP 106/62 mmHg  Pulse 78  Temp(Src) 98.4 F (36.9 C) (Oral)  Resp 14  Ht 5' 5"  (1.651 m)  Wt 147 lb (66.679 kg)  BMI 24.46 kg/m2  SpO2 97%  Physical Exam  Constitutional: She is oriented to person, place, and time and well-developed, well-nourished, and in no distress.  No jaundice, eyes non-icteric  HENT:  Head: Normocephalic and atraumatic.  Right Ear: Hearing and external ear normal.  Left Ear: Hearing and external ear normal.  Eyes: Conjunctivae are normal.  Neck: Normal range of motion.  Cardiovascular: Normal rate, regular rhythm and normal heart sounds.   No murmur heard. Pulmonary/Chest: Effort normal and breath sounds normal.  Abdominal: There is no hepatosplenomegaly.  Neurological: She is alert and oriented to person, place, and time. Gait normal.  Skin: Skin is warm and dry.  Psychiatric: Mood, memory, affect and judgment normal.  Vitals reviewed.   Assessment and Plan :  Hepatitis C antibody test positive - Plan: Hepatitis C antibody, Malaria Smear  Multiple joint pain - Plan: Sedimentation rate, B.  burgdorfi antibodies, Rheumatoid factor, ANA, Malaria Smear  Abnormal laboratory test - Plan: Trypanosoma cruzi Aby, Total, Pathologist smear review, Malaria Smear  Elevated LFTs - Plan: Hepatic function panel, Malaria Smear  Depression with anxiety - Plan: ALPRAZolam (XANAX) 0.25 MG tablet   Check labs and wait for those results to determine next step.  Windell Hummingbird PA-C  [facility] 01/20/2015 10:29 AM

## 2015-01-16 ENCOUNTER — Telehealth: Payer: Self-pay

## 2015-01-16 LAB — HEPATIC FUNCTION PANEL
ALBUMIN: 4.3 g/dL (ref 3.6–5.1)
ALK PHOS: 83 U/L (ref 33–115)
ALT: 25 U/L (ref 6–29)
AST: 23 U/L (ref 10–35)
BILIRUBIN TOTAL: 0.3 mg/dL (ref 0.2–1.2)
Bilirubin, Direct: <0.1 mg/dL (ref <=0.2)
Total Protein: 7.5 g/dL (ref 6.1–8.1)

## 2015-01-16 LAB — RHEUMATOID FACTOR: Rhuematoid fact SerPl-aCnc: <10 IU/mL (ref <=14)

## 2015-01-16 LAB — SEDIMENTATION RATE: SED RATE: 32 mm/h — AB (ref 0–20)

## 2015-01-16 LAB — HEPATITIS C ANTIBODY: HCV Ab: NEGATIVE

## 2015-01-16 NOTE — Telephone Encounter (Signed)
Kristine Lee, pt called. Wanted me to add a malaria smear to her blood. Sounded like you guys had already talked and she was going to call back with what test she wanted. Normally don't do this, but went ahead and added it because it is a time sensitive test. She is aware she may have to redraw for this.

## 2015-01-20 LAB — MALARIA SMEAR
RESULT - MALAR: NONE SEEN
Result - MALAR: NEGATIVE

## 2015-01-20 LAB — PATHOLOGIST SMEAR REVIEW

## 2015-01-20 LAB — TRYPANOSOMA CRUZI ANTIBODY, TOTAL

## 2015-01-20 LAB — ANA: ANA: NEGATIVE

## 2015-01-20 LAB — B. BURGDORFI ANTIBODIES: B BURGDORFERI AB IGG+ IGM: 0.35 {ISR}

## 2015-01-20 NOTE — Telephone Encounter (Signed)
Great - thanks

## 2015-01-22 ENCOUNTER — Encounter: Payer: Self-pay | Admitting: Physician Assistant

## 2015-02-10 ENCOUNTER — Encounter: Payer: Self-pay | Admitting: Podiatry

## 2015-02-10 ENCOUNTER — Ambulatory Visit: Payer: Self-pay

## 2015-02-10 ENCOUNTER — Encounter: Payer: Self-pay | Admitting: Physician Assistant

## 2015-02-10 ENCOUNTER — Ambulatory Visit (INDEPENDENT_AMBULATORY_CARE_PROVIDER_SITE_OTHER): Payer: 59 | Admitting: Podiatry

## 2015-02-10 VITALS — BP 111/76 | HR 70 | Resp 16

## 2015-02-10 DIAGNOSIS — M779 Enthesopathy, unspecified: Secondary | ICD-10-CM | POA: Diagnosis not present

## 2015-02-10 DIAGNOSIS — R937 Abnormal findings on diagnostic imaging of other parts of musculoskeletal system: Principal | ICD-10-CM

## 2015-02-10 DIAGNOSIS — R936 Abnormal findings on diagnostic imaging of limbs: Secondary | ICD-10-CM

## 2015-02-10 DIAGNOSIS — M79671 Pain in right foot: Secondary | ICD-10-CM

## 2015-02-10 NOTE — Progress Notes (Signed)
   Subjective:    Patient ID: Kristine Lee, female    DOB: 1971-03-20, 44 y.o.   MRN: [id number]  HPI: She presents today for chief complaint of pain about the first metatarsophalangeal joint. He states that recently he has had an on and off swelling event where even her scar from her previous bunion surgery will become painful and swollen. She states that she has had no trauma to the toe.    Review of Systems  Musculoskeletal: Positive for arthralgias.  Allergic/Immunologic: Positive for environmental allergies.  Neurological: Positive for headaches.  Psychiatric/Behavioral: The patient is nervous/anxious.   All other systems reviewed and are negative.      Objective:   Physical Exam: 44 year old female with a history of joint problems complaining of a painful first metatarsophalangeal joint. Vital signs are stable she is alert and oriented 3. Pulses are strongly palpable. Neurologic sensorium is intact her Semmes-Weinstein monofilament. Deep tendon reflexes are intact. Muscle strength +5 over 5 dorsiflexion plantar flexors and inverters everters all intrinsic musculature is intact. Orthopedic evaluation demonstrates all joints distal to the ankle range of motion without crepitation. She has pain on end range of motion particularly plantar flexion of the first metatarsophalangeal joint of the right foot there appears to be some fluid within the tendon sheath and the capsule itself on palpation. Radiographs confirm early joint space narrowing possibly associated with surgical correction or just early osteoarthritic changes. No fractures or trauma visualized.        Assessment & Plan:  Capsulitis first metatarsophalangeal joint right foot.  Plan: Discussed etiology pathology conservative versus surgical therapies. I injected the first metatarsophalangeal joint with dexamethasone local metastatic and she will follow up with me in 1 month.

## 2015-02-13 ENCOUNTER — Other Ambulatory Visit: Payer: Self-pay | Admitting: Orthopaedic Surgery

## 2015-02-13 DIAGNOSIS — M5412 Radiculopathy, cervical region: Secondary | ICD-10-CM

## 2015-02-13 NOTE — Telephone Encounter (Signed)
Pt mychart messaged me about wanting a referral for evaluation due to abnormal foot xray when she was seeing her podiatrist.

## 2015-02-17 ENCOUNTER — Telehealth: Payer: Self-pay | Admitting: *Deleted

## 2015-02-17 NOTE — Telephone Encounter (Signed)
Pt request copy of x-rays to give to her rheumatologist.  Left message informing pt the copy was ready for pick up.

## 2015-02-21 LAB — HEPATIC FUNCTION PANEL: AST: 26 (ref 13–35)

## 2015-02-24 ENCOUNTER — Ambulatory Visit
Admission: RE | Admit: 2015-02-24 | Discharge: 2015-02-24 | Disposition: A | Discharge status: Home / Self Care | Payer: 59 | Source: Ambulatory Visit | Attending: Orthopaedic Surgery | Admitting: Orthopaedic Surgery

## 2015-02-24 DIAGNOSIS — M5412 Radiculopathy, cervical region: Secondary | ICD-10-CM

## 2015-03-17 ENCOUNTER — Ambulatory Visit (INDEPENDENT_AMBULATORY_CARE_PROVIDER_SITE_OTHER): Payer: 59 | Admitting: Podiatry

## 2015-03-17 ENCOUNTER — Encounter: Payer: Self-pay | Admitting: Podiatry

## 2015-03-17 DIAGNOSIS — M779 Enthesopathy, unspecified: Secondary | ICD-10-CM | POA: Diagnosis not present

## 2015-03-17 NOTE — Progress Notes (Signed)
She presents today for follow-up of capsulitis first metatarsophalangeal joint right foot. Last time she was in we injected the first metatarsophalangeal joint she relates that is proximally 70% improved at this time. She continues anti-inflammatories as needed.  Objective: Vital signs are stable she is alert and oriented 3. Pulses are palpable. Neurologic sensorium is intact. She has good range of motion of first metatarsophalangeal joint of the right foot which is nontender on palpation today.  Assessment: Well-healing capsulitis first metatarsophalangeal joint of the right foot.  Plan: Discussed etiology pathology conservative versus surgical therapies. We discussed her shoe gear and the fact that her shoes are too short and too narrow possibly contributing to some of the symptoms around the plantar medial aspect of the first metatarsophalangeal joint. I recommended that she purchase a larger wider shoe and I will follow-up with her as needed.  Roselind Messier DPM

## 2015-03-24 ENCOUNTER — Other Ambulatory Visit: Payer: Self-pay | Admitting: Physician Assistant

## 2015-03-26 ENCOUNTER — Ambulatory Visit: Payer: 59 | Admitting: Physician Assistant

## 2015-04-16 ENCOUNTER — Ambulatory Visit: Payer: 59 | Admitting: Physician Assistant

## 2015-04-23 ENCOUNTER — Ambulatory Visit (INDEPENDENT_AMBULATORY_CARE_PROVIDER_SITE_OTHER): Payer: 59 | Admitting: Physician Assistant

## 2015-04-23 ENCOUNTER — Encounter: Payer: Self-pay | Admitting: Physician Assistant

## 2015-04-23 VITALS — BP 100/64 | HR 64 | Temp 97.5°F | Resp 16 | Ht 64.0 in | Wt 152.8 lb

## 2015-04-23 DIAGNOSIS — F418 Other specified anxiety disorders: Secondary | ICD-10-CM | POA: Diagnosis not present

## 2015-04-23 DIAGNOSIS — K219 Gastro-esophageal reflux disease without esophagitis: Secondary | ICD-10-CM

## 2015-04-23 DIAGNOSIS — F419 Anxiety disorder, unspecified: Secondary | ICD-10-CM

## 2015-04-23 DIAGNOSIS — M5030 Other cervical disc degeneration, unspecified cervical region: Secondary | ICD-10-CM | POA: Diagnosis not present

## 2015-04-23 DIAGNOSIS — F32A Depression, unspecified: Secondary | ICD-10-CM

## 2015-04-23 DIAGNOSIS — M7701 Medial epicondylitis, right elbow: Secondary | ICD-10-CM | POA: Insufficient documentation

## 2015-04-23 DIAGNOSIS — F329 Major depressive disorder, single episode, unspecified: Secondary | ICD-10-CM

## 2015-04-23 MED ORDER — FLUOXETINE HCL 20 MG PO TABS: ORAL_TABLET | ORAL | Status: DC

## 2015-04-23 MED ORDER — OMEPRAZOLE 40 MG PO CPDR: DELAYED_RELEASE_CAPSULE | ORAL | Status: DC

## 2015-04-23 NOTE — Progress Notes (Signed)
   Kristine Lee  MRN: [id number] DOB: Aug 17, 1970  Subjective:  Pt presents to clinic for a recheck and medication refill.  She has ben more irritable and over stimulated lately.  She has been working long hours without days off.  She keeps taking on more tasks at work even though she does not want a promotion and it is not in her job description.  She has found that she is irritable more than normal.  She does not use her Xanax because she is never sure when to use it.  Patient Active Problem List   Diagnosis Date Noted  . Degeneration of cervical intervertebral disc 04/23/2015  . Medial epicondylitis of right elbow 04/23/2015  . Anxiety and depression 04/18/2013    Current Outpatient Prescriptions on File Prior to Visit  Medication Sig Dispense Refill  . ALPRAZolam (XANAX) 0.25 MG tablet Take 0.5-1 tablets (0.125-0.25 mg total) by mouth 2 (two) times daily as needed for anxiety. 20 tablet 0  . cetirizine (ZYRTEC) 10 MG tablet Take 10 mg by mouth daily.    . fluticasone (FLONASE) 50 MCG/ACT nasal spray Place 2 sprays into both nostrils daily. 48 g 3   No current facility-administered medications on file prior to visit.    Allergies  Allergen Reactions  . Biaxin [Clarithromycin] Nausea And Vomiting  . Vancomycin Other (See Comments)    RedMan Syndrome    Review of Systems  Musculoskeletal: Positive for neck pain (sees Dr Lorre Nick office).  Psychiatric/Behavioral: Negative for dysphoric mood. The patient is nervous/anxious.    Objective:  BP 100/64 mmHg  Pulse 64  Temp(Src) 97.5 F (36.4 C) (Oral)  Resp 16  Ht 5' 4"  (1.626 m)  Wt 152 lb 12.8 oz (69.31 kg)  BMI 26.22 kg/m2  SpO2 98%  LMP 12/12/2014  Physical Exam  Constitutional: She is oriented to person, place, and time and well-developed, well-nourished, and in no distress.  HENT:  Head: Normocephalic and atraumatic.  Right Ear: Hearing and external ear normal.  Left Ear: Hearing and external ear normal.  Eyes:  Conjunctivae are normal.  Neck: Normal range of motion.  Pulmonary/Chest: Effort normal.  Neurological: She is alert and oriented to person, place, and time. Gait normal.  Skin: Skin is warm and dry.  Psychiatric: Mood, memory, affect and judgment normal.  Vitals reviewed.   Assessment and Plan :  Degeneration of cervical intervertebral disc  Medial epicondylitis of right elbow  Gastroesophageal reflux disease without esophagitis - Plan: omeprazole (PRILOSEC) 40 MG capsule  Anxiety and depression - Plan: FLUoxetine (PROZAC) 20 MG tablet   Continue current medications - pt plans to schedule a CPE with me in the spring.  She has been told she has high cholesterol and she started taking red yeast rice and iron supplements.  Windell Hummingbird PA-C  [facility] 04/23/2015 5:22 PM

## 2015-04-23 NOTE — Patient Instructions (Signed)
Ferrous gluconate - better absorbed and less side effects

## 2015-06-22 ENCOUNTER — Ambulatory Visit (INDEPENDENT_AMBULATORY_CARE_PROVIDER_SITE_OTHER): Payer: BLUE CROSS/BLUE SHIELD

## 2015-06-22 ENCOUNTER — Ambulatory Visit (INDEPENDENT_AMBULATORY_CARE_PROVIDER_SITE_OTHER): Payer: BLUE CROSS/BLUE SHIELD | Admitting: Physician Assistant

## 2015-06-22 VITALS — BP 118/80 | HR 75 | Temp 97.7°F | Resp 16 | Ht 64.0 in | Wt 148.0 lb

## 2015-06-22 DIAGNOSIS — J988 Other specified respiratory disorders: Secondary | ICD-10-CM

## 2015-06-22 DIAGNOSIS — R062 Wheezing: Secondary | ICD-10-CM

## 2015-06-22 DIAGNOSIS — R05 Cough: Secondary | ICD-10-CM

## 2015-06-22 DIAGNOSIS — R058 Other specified cough: Secondary | ICD-10-CM

## 2015-06-22 DIAGNOSIS — J9801 Acute bronchospasm: Secondary | ICD-10-CM | POA: Diagnosis not present

## 2015-06-22 DIAGNOSIS — J22 Unspecified acute lower respiratory infection: Secondary | ICD-10-CM

## 2015-06-22 LAB — POCT CBC
GRANULOCYTE PERCENT: 44 % (ref 37–80)
HCT, POC: 42 % (ref 37.7–47.9)
HEMOGLOBIN: 14.2 g/dL (ref 12.2–16.2)
Lymph, poc: 2.5 (ref 0.6–3.4)
MCH, POC: 27.3 pg (ref 27–31.2)
MCHC: 33.7 g/dL (ref 31.8–35.4)
MCV: 81 fL (ref 80–97)
MID (cbc): 0.4 (ref 0–0.9)
MPV: 8.1 fL (ref 0–99.8)
POC GRANULOCYTE: 2.3 (ref 2–6.9)
POC LYMPH PERCENT: 48.7 %L (ref 10–50)
POC MID %: 7.3 %M (ref 0–12)
Platelet Count, POC: 207 10*3/uL (ref 142–424)
RBC: 5.19 M/uL (ref 4.04–5.48)
RDW, POC: 17.5 %
WBC: 5.2 10*3/uL (ref 4.6–10.2)

## 2015-06-22 MED ORDER — IPRATROPIUM BROMIDE 0.02 % IN SOLN
0.5000 mg | Freq: Once | RESPIRATORY_TRACT | Status: AC
Start: 2015-06-22 — End: 2015-06-22
  Administered 2015-06-22: 0.5 mg via RESPIRATORY_TRACT

## 2015-06-22 MED ORDER — HYDROCOD POLST-CPM POLST ER 10-8 MG/5ML PO SUER: 5.0000 mL | Freq: Every evening | ORAL | Status: DC | PRN

## 2015-06-22 MED ORDER — DOXYCYCLINE HYCLATE 100 MG PO CAPS: 100.0000 mg | ORAL_CAPSULE | Freq: Two times a day (BID) | ORAL | Status: AC

## 2015-06-22 MED ORDER — ALBUTEROL SULFATE (2.5 MG/3ML) 0.083% IN NEBU
2.5000 mg | INHALATION_SOLUTION | Freq: Once | RESPIRATORY_TRACT | Status: AC
  Administered 2015-06-22: 2.5 mg via RESPIRATORY_TRACT

## 2015-06-22 MED ORDER — PREDNISONE 20 MG PO TABS: ORAL_TABLET | ORAL | Status: DC

## 2015-06-22 NOTE — Progress Notes (Signed)
[facility] [address] [place] [address] [phone]  Date:  06/22/2015   Name:  Kristine Lee   DOB:  1971/04/27   MRN:  [id number]  PCP:  Elizabeth Sauer   Chief Complaint  Patient presents with  . Cough    x 2 weeks  . Shortness of Breath    x 6 days    History of Present Illness:  Kristine Lee is a 45 y.o. female patient who presents to [facility] for cc of nasal congestion.  4 weeks ago she had cold like symptoms of nasal congestion, cough, and sore throat.  These lasted about 3 weeks then finally resolved.  She thought that she was getting better, but one week ago, developed fever, chills, body aches, and nasal congestion.  Sob, and wheezing started.  Coughing intermittently productive yellow sputum.  She has no hx of asthma.  She is not utd on flu vaccine.        Patient Active Problem List   Diagnosis Date Noted  . Degeneration of cervical intervertebral disc 04/23/2015  . Medial epicondylitis of right elbow 04/23/2015  . Anxiety and depression 04/18/2013    Past Medical History  Diagnosis Date  . Allergy   . Arthritis   . GERD (gastroesophageal reflux disease)   . Hyperlipidemia   . Anxiety     Past Surgical History  Procedure Laterality Date  . Knee surgery    . Feet surgery    . Tonsillectomy    . Eye surgery    . Fracture surgery    . Cholecystectomy, laparoscopic  09/14/2014  . Rotator cuff Right 08/15/2014    Social History  Substance Use Topics  . Smoking status: Never Smoker   . Smokeless tobacco: Never Used  . Alcohol Use: No    Family History  Problem Relation Age of Onset  . Heart disease Father   . Diabetes Sister   . Depression Sister     current on Prozac  . Diabetes Sister   . Depression Mother     currentlty on Prozac  . Diabetes Mother     Allergies  Allergen Reactions  . Biaxin [Clarithromycin] Nausea And Vomiting  . Vancomycin Other (See Comments)    RedMan Syndrome    Medication list has  been reviewed and updated.  Current Outpatient Prescriptions on File Prior to Visit  Medication Sig Dispense Refill  . ALPRAZolam (XANAX) 0.25 MG tablet Take 0.5-1 tablets (0.125-0.25 mg total) by mouth 2 (two) times daily as needed for anxiety. 20 tablet 0  . cetirizine (ZYRTEC) 10 MG tablet Take 10 mg by mouth daily.    Marland Kitchen FLUoxetine (PROZAC) 20 MG tablet TAKE 1 TABLET (20 MG TOTAL) BY MOUTH DAILY. 90 tablet 1  . fluticasone (FLONASE) 50 MCG/ACT nasal spray Place 2 sprays into both nostrils daily. 48 g 3  . gabapentin (NEURONTIN) 300 MG capsule Take 300 mg by mouth 2 (two) times daily.    Marland Kitchen omeprazole (PRILOSEC) 40 MG capsule TAKE ONE CAPSULE BY MOUTH EVERY DAY 90 capsule 3   No current facility-administered medications on file prior to visit.    ROS ROS otherwise unremarkable unless listed above.  Physical Examination: BP 118/80 mmHg  Pulse 75  Temp(Src) 97.7 F (36.5 C) (Oral)  Resp 16  Ht 5' 4"  (1.626 m)  Wt 148 lb (67.132 kg)  BMI 25.39 kg/m2  SpO2 97%  LMP 05/21/2015 (Exact Date) Ideal Body Weight: Weight in (lb)  to have BMI = 25: 145.3  Physical Exam  Constitutional: She is oriented to person, place, and time. She appears well-developed and well-nourished. No distress.  HENT:  Head: Normocephalic and atraumatic.  Right Ear: External ear normal.  Left Ear: External ear normal.  Eyes: Conjunctivae and EOM are normal. Pupils are equal, round, and reactive to light.  Neck: No tracheal deviation present. No thyromegaly present.  Cardiovascular: Normal rate and regular rhythm.  Exam reveals no friction rub.   No murmur heard. Pulses:      Radial pulses are 2+ on the right side, and 2+ on the left side.  Pulmonary/Chest: Effort normal. No apnea. No respiratory distress. She has no decreased breath sounds. She has wheezes. She has no rhonchi.  Abdominal: Soft. Bowel sounds are normal. She exhibits no distension. There is no tenderness.  Musculoskeletal: She exhibits no  tenderness.  Lymphadenopathy:    She has no cervical adenopathy.  Neurological: She is alert and oriented to person, place, and time. She displays normal reflexes. Coordination normal.  Skin: Skin is warm and dry. She is not diaphoretic.  Psychiatric: She has a normal mood and affect. Her behavior is normal.   Results for orders placed or performed in visit on 06/22/15  POCT CBC  Result Value Ref Range   WBC 5.2 4.6 - 10.2 K/uL   Lymph, poc 2.5 0.6 - 3.4   POC LYMPH PERCENT 48.7 10 - 50 %L   MID (cbc) 0.4 0 - 0.9   POC MID % 7.3 0 - 12 %M   POC Granulocyte 2.3 2 - 6.9   Granulocyte percent 44.0 37 - 80 %G   RBC 5.19 4.04 - 5.48 M/uL   Hemoglobin 14.2 12.2 - 16.2 g/dL   HCT, POC 42.0 37.7 - 47.9 %   MCV 81.0 80 - 97 fL   MCH, POC 27.3 27 - 31.2 pg   MCHC 33.7 31.8 - 35.4 g/dL   RDW, POC 17.5 %   Platelet Count, POC 207 142 - 424 K/uL   MPV 8.1 0 - 99.8 fL     Assessment and Plan: Kristine Lee is a 45 y.o. female who is here today for cc of cough and wheezing. Starting prednisone tomorrow morning We will start the doxycycline Alarming sxs given to warrant immediate return. Lower respiratory infection (e.g., bronchitis, pneumonia, pneumonitis, pulmonitis) - Plan: doxycycline (VIBRAMYCIN) 100 MG capsule, chlorpheniramine-HYDROcodone (TUSSIONEX PENNKINETIC ER) 10-8 MG/5ML SUER  Wheezing - Plan: albuterol (PROVENTIL) (2.5 MG/3ML) 0.083% nebulizer solution 2.5 mg, ipratropium (ATROVENT) nebulizer solution 0.5 mg, POCT CBC, DG Chest 2 View  Productive cough - Plan: POCT CBC, DG Chest 2 View  Bronchospasm - Plan: predniSONE (DELTASONE) 20 MG tablet  Ivar Drape, PA-C [facility] 2/7/20177:31 AM

## 2015-06-22 NOTE — Patient Instructions (Addendum)
Because you received an x-ray today, you will receive an invoice from [facility]. Please contact [facility] at [phone] with questions or concerns regarding your invoice. Our billing staff will not be able to assist you with those questions. Please take mucinex 1200 mg every 12 hours. Please take the prednisone and doxycycline as prescribed.   Community-Acquired Pneumonia, Adult Pneumonia is an infection of the lungs. There are different types of pneumonia. One type can develop while a person is in a hospital. A different type, called community-acquired pneumonia, develops in people who are not, or have not recently been, in the hospital or other health care facility.  CAUSES Pneumonia may be caused by bacteria, viruses, or funguses. Community-acquired pneumonia is often caused by Streptococcus pneumonia bacteria. These bacteria are often passed from one person to another by breathing in droplets from the cough or sneeze of an infected person. RISK FACTORS The condition is more likely to develop in:  People who havechronic diseases, such as chronic obstructive pulmonary disease (COPD), asthma, congestive heart failure, cystic fibrosis, diabetes, or kidney disease.  People who haveearly-stage or late-stage HIV.  People who havesickle cell disease.  People who havehad their spleen removed (splenectomy).  People who havepoor Human resources officer.  People who havemedical conditions that increase the risk of breathing in (aspirating) secretions their own mouth and nose.   People who havea weakened immune system (immunocompromised).  People who smoke.  People whotravel to areas where pneumonia-causing germs commonly exist.  People whoare around animal habitats or animals that have pneumonia-causing germs, including birds, bats, rabbits, cats, and farm animals. SYMPTOMS Symptoms of this condition include:  Adry cough.  A wet (productive)  cough.  Fever.  Sweating.  Chest pain, especially when breathing deeply or coughing.  Rapid breathing or difficulty breathing.  Shortness of breath.  Shaking chills.  Fatigue.  Muscle aches. DIAGNOSIS Your health care provider will take a medical history and perform a physical exam. You may also have other tests, including:  Imaging studies of your chest, including X-rays.  Tests to check your blood oxygen level and other blood gases.  Other tests on blood, mucus (sputum), fluid around your lungs (pleural fluid), and urine. If your pneumonia is severe, other tests may be done to identify the specific cause of your illness. TREATMENT The type of treatment that you receive depends on many factors, such as the cause of your pneumonia, the medicines you take, and other medical conditions that you have. For most adults, treatment and recovery from pneumonia may occur at home. In some cases, treatment must happen in a hospital. Treatment may include:  Antibiotic medicines, if the pneumonia was caused by bacteria.  Antiviral medicines, if the pneumonia was caused by a virus.  Medicines that are given by mouth or through an IV tube.  Oxygen.  Respiratory therapy. Although rare, treating severe pneumonia may include:  Mechanical ventilation. This is done if you are not breathing well on your own and you cannot maintain a safe blood oxygen level.  Thoracentesis. This procedureremoves fluid around one lung or both lungs to help you breathe better. HOME CARE INSTRUCTIONS  Take over-the-counter and prescription medicines only as told by your health care provider.  Only takecough medicine if you are losing sleep. Understand that cough medicine can prevent your body's natural ability to remove mucus from your lungs.  If you were prescribed an antibiotic medicine, take it as told by your health care provider. Do not stop taking the antibiotic even  if you start to feel  better.  Sleep in a semi-upright position at night. Try sleeping in a reclining chair, or place a few pillows under your head.  Do not use tobacco products, including cigarettes, chewing tobacco, and e-cigarettes. If you need help quitting, ask your health care provider.  Drink enough water to keep your urine clear or pale yellow. This will help to thin out mucus secretions in your lungs. PREVENTION There are ways that you can decrease your risk of developing community-acquired pneumonia. Consider getting a pneumococcal vaccine if:  You are older than 46 years of age.  You are older than 46 years of age and are undergoing cancer treatment, have chronic lung disease, or have other medical conditions that affect your immune system. Ask your health care provider if this applies to you. There are different types and schedules of pneumococcal vaccines. Ask your health care provider which vaccination option is best for you. You may also prevent community-acquired pneumonia if you take these actions:  Get an influenza vaccine every year. Ask your health care provider which type of influenza vaccine is best for you.  Go to the dentist on a regular basis.  Wash your hands often. Use hand sanitizer if soap and water are not available. SEEK MEDICAL CARE IF:  You have a fever.  You are losing sleep because you cannot control your cough with cough medicine. SEEK IMMEDIATE MEDICAL CARE IF:  You have worsening shortness of breath.  You have increased chest pain.  Your sickness becomes worse, especially if you are an older adult or have a weakened immune system.  You cough up blood.   This information is not intended to replace advice given to you by your health care provider. Make sure you discuss any questions you have with your health care provider.   Document Released: 05/02/2005 Document Revised: 01/21/2015 Document Reviewed: 08/27/2014 Elsevier Interactive Patient Education [employer]

## 2015-06-25 ENCOUNTER — Telehealth: Payer: Self-pay

## 2015-06-25 ENCOUNTER — Encounter: Payer: Self-pay | Admitting: Physician Assistant

## 2015-06-25 NOTE — Telephone Encounter (Signed)
Pt called again. Please advise.

## 2015-06-25 NOTE — Telephone Encounter (Signed)
PATIENT STATES SHE SAW STEPHANIE ENGLISH ON Monday FOR A LOWER RESPIRATORY INFECTION. STEPHANIE TOLD HER TO CALL BACK IN 3 DAYS IF SHE WAS NOT ANY BETTER. SHE IS STILL WHEEZING AND COUGHING. SHE IS TAKING THE MUCINEX AND PREDNISONE. SHE IS THINKING THAT SHE MAY NEED AN INHALER.  BEST PHONE [phone] (CELL) IF SHE DOES NOT ANSWER PLEASE LEAVE A MESSAGE BECAUSE SHE WILL PROBABLY BE IN A MEETING.  PHARMACY CHOICE IS [facility] [place].  [place]

## 2015-06-26 NOTE — Telephone Encounter (Signed)
Spoke with patient last night around 7pm.  Advised that she go the ED if her sxs are worsening.  She continues to have tightness despite treatment compliance.  If she feels the same, she can return this morning.  I have advised that she see Dr. Tamala Julian, as she was briefed on her illness.  Patient voiced understanding.

## 2015-06-27 ENCOUNTER — Telehealth: Payer: Self-pay

## 2015-06-27 NOTE — Telephone Encounter (Signed)
Saw that patient did not come to see Dr. Tamala Julian yesterday. Called to check up on patient. Pt said the wheezing and tightness is worse at night. Pt was unable to come to clinic yesterday due to work. Pt said she is going to come in Monday to be seen unless she starts to feel worse this weekend.

## 2015-06-27 NOTE — Telephone Encounter (Signed)
Colletta Maryland spoke to pt.

## 2015-06-27 NOTE — Telephone Encounter (Signed)
Pt saw her appt with Judson Roch was cancelled in March. She said she did not receive a call as to why it was cancelled. Please call patient to re-schedule appointment. I advised pt it was probably cancelled because we have changed the days providers see patient in appointments.

## 2015-06-29 ENCOUNTER — Ambulatory Visit (INDEPENDENT_AMBULATORY_CARE_PROVIDER_SITE_OTHER): Payer: BLUE CROSS/BLUE SHIELD | Admitting: Physician Assistant

## 2015-06-29 ENCOUNTER — Other Ambulatory Visit: Payer: Self-pay | Admitting: Physician Assistant

## 2015-06-29 VITALS — BP 115/82 | HR 78 | Temp 98.1°F | Resp 17 | Ht 64.0 in | Wt 151.0 lb

## 2015-06-29 DIAGNOSIS — R059 Cough, unspecified: Secondary | ICD-10-CM

## 2015-06-29 DIAGNOSIS — R05 Cough: Secondary | ICD-10-CM

## 2015-06-29 DIAGNOSIS — J209 Acute bronchitis, unspecified: Secondary | ICD-10-CM | POA: Diagnosis not present

## 2015-06-29 DIAGNOSIS — J9801 Acute bronchospasm: Secondary | ICD-10-CM

## 2015-06-29 DIAGNOSIS — R0600 Dyspnea, unspecified: Secondary | ICD-10-CM

## 2015-06-29 LAB — POCT CBC
GRANULOCYTE PERCENT: 70.9 % (ref 37–80)
HCT, POC: 38.3 % (ref 37.7–47.9)
Hemoglobin: 13 g/dL (ref 12.2–16.2)
Lymph, poc: 2.7 (ref 0.6–3.4)
MCH: 27.7 pg (ref 27–31.2)
MCHC: 34 g/dL (ref 31.8–35.4)
MCV: 81.6 fL (ref 80–97)
MID (CBC): 0.3 (ref 0–0.9)
MPV: 6.8 fL (ref 0–99.8)
PLATELET COUNT, POC: 327 10*3/uL (ref 142–424)
POC Granulocyte: 7.4 — AB (ref 2–6.9)
POC LYMPH %: 25.8 % (ref 10–50)
POC MID %: 3.3 % (ref 0–12)
RBC: 4.69 M/uL (ref 4.04–5.48)
RDW, POC: 17.7 %
WBC: 10.4 10*3/uL — AB (ref 4.6–10.2)

## 2015-06-29 MED ORDER — ALBUTEROL SULFATE (2.5 MG/3ML) 0.083% IN NEBU
2.5000 mg | INHALATION_SOLUTION | Freq: Once | RESPIRATORY_TRACT | Status: AC
  Administered 2015-06-29: 2.5 mg via RESPIRATORY_TRACT

## 2015-06-29 MED ORDER — ALBUTEROL SULFATE HFA 108 (90 BASE) MCG/ACT IN AERS: 2.0000 | INHALATION_SPRAY | RESPIRATORY_TRACT | Status: DC | PRN

## 2015-06-29 NOTE — Patient Instructions (Signed)
I would like you to make sure that you are taking the mucinex.   Please continue to hydrate greatly with 64 oz of water per day.   Please place inhaler ever 4 hours-6 hours for the next 24 hours.  Then I would like you to continue 4 times per day for the next week.   Please return in 1 week, or sooner if your symptoms worsen.

## 2015-06-29 NOTE — Progress Notes (Signed)
[facility] [address], [place] [address] [phone]  Date:  06/29/2015   Name:  Kristine Lee   DOB:  1970-12-14   MRN:  [id number]  PCP:  Elizabeth Sauer    History of Present Illness:  Kristine Lee is a 45 y.o. female patient who presents to [facility] for cc of dyspnea and continued cough.  Patient was seen here 7 days ago for wheezing and cough.  She was given doxycycline and 9 day taper of prednisone, and supportive treatment of tussionex.  She states that the tussionex has been helpful toward her sleep, however she continues to have dyspnea.  Any strenuous activity causes her wheezing and sob.  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  She states that she has decreased her exercise.  She has attempted to breathe in hot water. Patient confesses to have started the abx 3 days after her visit.  She had noticed some improvement within the first 48 hours of taking abx.      Patient Active Problem List   Diagnosis Date Noted  . Degeneration of cervical intervertebral disc 04/23/2015  . Medial epicondylitis of right elbow 04/23/2015  . Anxiety and depression 04/18/2013    Past Medical History  Diagnosis Date  . Allergy   . Arthritis   . GERD (gastroesophageal reflux disease)   . Hyperlipidemia   . Anxiety     Past Surgical History  Procedure Laterality Date  . Knee surgery    . Feet surgery    . Tonsillectomy    . Eye surgery    . Fracture surgery    . Cholecystectomy, laparoscopic  09/14/2014  . Rotator cuff Right 08/15/2014    Social History  Substance Use Topics  . Smoking status: Never Smoker   . Smokeless tobacco: Never Used  . Alcohol Use: No    Family History  Problem Relation Age of Onset  . Heart disease Father   . Diabetes Sister   . Depression Sister     current on Prozac  . Diabetes Sister   . Depression Mother     currentlty on Prozac  . Diabetes Mother     Allergies  Allergen Reactions  . Biaxin  [Clarithromycin] Nausea And Vomiting  . Vancomycin Other (See Comments)    RedMan Syndrome    Medication list has been reviewed and updated.  Current Outpatient Prescriptions on File Prior to Visit  Medication Sig Dispense Refill  . ALPRAZolam (XANAX) 0.25 MG tablet Take 0.5-1 tablets (0.125-0.25 mg total) by mouth 2 (two) times daily as needed for anxiety. 20 tablet 0  . cetirizine (ZYRTEC) 10 MG tablet Take 10 mg by mouth daily.    . chlorpheniramine-HYDROcodone (TUSSIONEX PENNKINETIC ER) 10-8 MG/5ML SUER Take 5 mLs by mouth at bedtime as needed. 80 mL 0  . doxycycline (VIBRAMYCIN) 100 MG capsule Take 1 capsule (100 mg total) by mouth 2 (two) times daily. 20 capsule 0  . FLUoxetine (PROZAC) 20 MG tablet TAKE 1 TABLET (20 MG TOTAL) BY MOUTH DAILY. 90 tablet 1  . fluticasone (FLONASE) 50 MCG/ACT nasal spray Place 2 sprays into both nostrils daily. 48 g 3  . gabapentin (NEURONTIN) 300 MG capsule Take 300 mg by mouth 2 (two) times daily.    Marland Kitchen omeprazole (PRILOSEC) 40 MG capsule TAKE ONE CAPSULE BY MOUTH EVERY DAY 90 capsule 3  . predniSONE (DELTASONE) 20 MG tablet Take 3 PO QAM x3days, 2 PO QAM x3days,  1 PO QAM x3days 18 tablet 0   No current facility-administered medications on file prior to visit.    ROS ROS otherwise unremarkable unless listed above.   Physical Examination: BP 115/82 mmHg  Pulse 78  Temp(Src) 98.1 F (36.7 C) (Oral)  Resp 17  Ht 5' 4"  (1.626 m)  Wt 151 lb (68.493 kg)  BMI 25.91 kg/m2  SpO2 97%  PF 350 L/min  LMP 05/21/2015 (Exact Date) Ideal Body Weight: Weight in (lb) to have BMI = 25: 145.3  Physical Exam  Constitutional: She is oriented to person, place, and time. She appears well-developed and well-nourished. No distress.  HENT:  Head: Normocephalic and atraumatic.  Right Ear: Tympanic membrane, external ear and ear canal normal.  Left Ear: Tympanic membrane, external ear and ear canal normal.  Nose: Rhinorrhea present. No mucosal edema.   Mouth/Throat: No oropharyngeal exudate, posterior oropharyngeal edema or posterior oropharyngeal erythema.  Eyes: Conjunctivae and EOM are normal. Pupils are equal, round, and reactive to light.  Cardiovascular: Normal rate, regular rhythm and normal heart sounds.  Exam reveals no gallop.   No murmur heard. Pulses:      Radial pulses are 2+ on the right side, and 2+ on the left side.  Pulmonary/Chest: Effort normal. No respiratory distress. She has no decreased breath sounds. She has wheezes (minimal expiratory wheezing). She has no rhonchi.  Lymphadenopathy:    She has no cervical adenopathy.  Neurological: She is alert and oriented to person, place, and time.  Skin: She is not diaphoretic.  Psychiatric: She has a normal mood and affect. Her behavior is normal.   Results for orders placed or performed in visit on 06/29/15  POCT CBC  Result Value Ref Range   WBC 10.4 (A) 4.6 - 10.2 K/uL   Lymph, poc 2.7 0.6 - 3.4   POC LYMPH PERCENT 25.8 10 - 50 %L   MID (cbc) 0.3 0 - 0.9   POC MID % 3.3 0 - 12 %M   POC Granulocyte 7.4 (A) 2 - 6.9   Granulocyte percent 70.9 37 - 80 %G   RBC 4.69 4.04 - 5.48 M/uL   Hemoglobin 13.0 12.2 - 16.2 g/dL   HCT, POC 38.3 37.7 - 47.9 %   MCV 81.6 80 - 97 fL   MCH, POC 27.7 27 - 31.2 pg   MCHC 34.0 31.8 - 35.4 g/dL   RDW, POC 17.7 %   Platelet Count, POC 327 142 - 424 K/uL   MPV 6.8 0 - 99.8 fL   Assessment and Plan: [name] is a 45 y.o. female who is here today for cough and dyspnea continued.  Though there is still complaints, she has made great improvement in lung sounds and overall condition.  Prednisone likely increased wbc.  Advised to continue doxycycline to completion.  He will start albuterol every 4 hours for the next 24 hours.  She will then increased to four times per day.  She will return in 7 days.  Sooner for alarming sxs discussed.  Acute bronchitis, unspecified organism - Plan: albuterol (PROVENTIL HFA;VENTOLIN HFA) 108 (90 Base)  MCG/ACT inhaler  Dyspnea - Plan: POCT CBC, albuterol (PROVENTIL) (2.5 MG/3ML) 0.083% nebulizer solution 2.5 mg  Cough - Plan: POCT CBC, albuterol (PROVENTIL) (2.5 MG/3ML) 0.083% nebulizer solution 2.5 mg  Bronchospasm  Ivar Drape, PA-C [facility] 2/14/20177:36 AM

## 2015-07-30 ENCOUNTER — Encounter: Payer: Self-pay | Admitting: Physician Assistant

## 2015-09-19 DIAGNOSIS — F331 Major depressive disorder, recurrent, moderate: Secondary | ICD-10-CM | POA: Diagnosis not present

## 2015-10-08 DIAGNOSIS — Z0000 Encounter for general adult medical examination without abnormal findings: Secondary | ICD-10-CM | POA: Diagnosis not present

## 2015-10-16 DIAGNOSIS — S5401XD Injury of ulnar nerve at forearm level, right arm, subsequent encounter: Secondary | ICD-10-CM | POA: Diagnosis not present

## 2015-10-16 DIAGNOSIS — S46091D Other injury of muscle(s) and tendon(s) of the rotator cuff of right shoulder, subsequent encounter: Secondary | ICD-10-CM | POA: Diagnosis not present

## 2015-10-16 DIAGNOSIS — M542 Cervicalgia: Secondary | ICD-10-CM | POA: Diagnosis not present

## 2015-10-16 DIAGNOSIS — Z6824 Body mass index (BMI) 24.0-24.9, adult: Secondary | ICD-10-CM | POA: Diagnosis not present

## 2015-10-19 DIAGNOSIS — F411 Generalized anxiety disorder: Secondary | ICD-10-CM | POA: Diagnosis not present

## 2015-10-19 DIAGNOSIS — Z630 Problems in relationship with spouse or partner: Secondary | ICD-10-CM | POA: Diagnosis not present

## 2015-10-20 DIAGNOSIS — Z4789 Encounter for other orthopedic aftercare: Secondary | ICD-10-CM | POA: Diagnosis not present

## 2015-10-20 DIAGNOSIS — S46011D Strain of muscle(s) and tendon(s) of the rotator cuff of right shoulder, subsequent encounter: Secondary | ICD-10-CM | POA: Diagnosis not present

## 2015-10-27 DIAGNOSIS — S46011D Strain of muscle(s) and tendon(s) of the rotator cuff of right shoulder, subsequent encounter: Secondary | ICD-10-CM | POA: Diagnosis not present

## 2015-10-27 DIAGNOSIS — M25511 Pain in right shoulder: Secondary | ICD-10-CM | POA: Diagnosis not present

## 2015-10-28 DIAGNOSIS — Z630 Problems in relationship with spouse or partner: Secondary | ICD-10-CM | POA: Diagnosis not present

## 2015-10-28 DIAGNOSIS — F411 Generalized anxiety disorder: Secondary | ICD-10-CM | POA: Diagnosis not present

## 2015-11-02 DIAGNOSIS — M25511 Pain in right shoulder: Secondary | ICD-10-CM | POA: Diagnosis not present

## 2015-11-02 DIAGNOSIS — G8929 Other chronic pain: Secondary | ICD-10-CM | POA: Diagnosis not present

## 2015-11-18 DIAGNOSIS — Z630 Problems in relationship with spouse or partner: Secondary | ICD-10-CM | POA: Diagnosis not present

## 2015-11-18 DIAGNOSIS — F411 Generalized anxiety disorder: Secondary | ICD-10-CM | POA: Diagnosis not present

## 2015-11-25 DIAGNOSIS — M25521 Pain in right elbow: Secondary | ICD-10-CM | POA: Diagnosis not present

## 2015-11-25 DIAGNOSIS — M25531 Pain in right wrist: Secondary | ICD-10-CM | POA: Diagnosis not present

## 2015-11-25 DIAGNOSIS — M7701 Medial epicondylitis, right elbow: Secondary | ICD-10-CM | POA: Diagnosis not present

## 2015-12-05 DIAGNOSIS — M654 Radial styloid tenosynovitis [de Quervain]: Secondary | ICD-10-CM | POA: Diagnosis not present

## 2015-12-09 DIAGNOSIS — F411 Generalized anxiety disorder: Secondary | ICD-10-CM | POA: Diagnosis not present

## 2015-12-09 DIAGNOSIS — Z630 Problems in relationship with spouse or partner: Secondary | ICD-10-CM | POA: Diagnosis not present

## 2015-12-14 DIAGNOSIS — M7701 Medial epicondylitis, right elbow: Secondary | ICD-10-CM | POA: Diagnosis not present

## 2015-12-14 DIAGNOSIS — G5621 Lesion of ulnar nerve, right upper limb: Secondary | ICD-10-CM | POA: Diagnosis not present

## 2015-12-14 DIAGNOSIS — M25521 Pain in right elbow: Secondary | ICD-10-CM | POA: Diagnosis not present

## 2015-12-14 DIAGNOSIS — M25531 Pain in right wrist: Secondary | ICD-10-CM | POA: Diagnosis not present

## 2015-12-16 DIAGNOSIS — F411 Generalized anxiety disorder: Secondary | ICD-10-CM | POA: Diagnosis not present

## 2015-12-16 DIAGNOSIS — Z630 Problems in relationship with spouse or partner: Secondary | ICD-10-CM | POA: Diagnosis not present

## 2015-12-18 DIAGNOSIS — S5401XD Injury of ulnar nerve at forearm level, right arm, subsequent encounter: Secondary | ICD-10-CM | POA: Diagnosis not present

## 2015-12-18 DIAGNOSIS — M542 Cervicalgia: Secondary | ICD-10-CM | POA: Diagnosis not present

## 2015-12-18 DIAGNOSIS — S46091D Other injury of muscle(s) and tendon(s) of the rotator cuff of right shoulder, subsequent encounter: Secondary | ICD-10-CM | POA: Diagnosis not present

## 2016-01-03 ENCOUNTER — Other Ambulatory Visit: Payer: Self-pay | Admitting: Physician Assistant

## 2016-01-06 DIAGNOSIS — F411 Generalized anxiety disorder: Secondary | ICD-10-CM | POA: Diagnosis not present

## 2016-01-24 ENCOUNTER — Other Ambulatory Visit: Payer: Self-pay | Admitting: Physician Assistant

## 2016-01-25 DIAGNOSIS — F411 Generalized anxiety disorder: Secondary | ICD-10-CM | POA: Diagnosis not present

## 2016-02-11 DIAGNOSIS — Z780 Asymptomatic menopausal state: Secondary | ICD-10-CM | POA: Diagnosis not present

## 2016-02-11 DIAGNOSIS — Z01419 Encounter for gynecological examination (general) (routine) without abnormal findings: Secondary | ICD-10-CM | POA: Diagnosis not present

## 2016-02-11 DIAGNOSIS — Z1231 Encounter for screening mammogram for malignant neoplasm of breast: Secondary | ICD-10-CM | POA: Diagnosis not present

## 2016-02-11 DIAGNOSIS — Z6824 Body mass index (BMI) 24.0-24.9, adult: Secondary | ICD-10-CM | POA: Diagnosis not present

## 2016-02-17 ENCOUNTER — Other Ambulatory Visit: Payer: Self-pay | Admitting: *Deleted

## 2016-02-17 ENCOUNTER — Other Ambulatory Visit: Payer: Self-pay | Admitting: Obstetrics and Gynecology

## 2016-02-17 DIAGNOSIS — F411 Generalized anxiety disorder: Secondary | ICD-10-CM | POA: Diagnosis not present

## 2016-02-17 DIAGNOSIS — R928 Other abnormal and inconclusive findings on diagnostic imaging of breast: Secondary | ICD-10-CM

## 2016-02-17 DIAGNOSIS — Z630 Problems in relationship with spouse or partner: Secondary | ICD-10-CM | POA: Diagnosis not present

## 2016-02-22 ENCOUNTER — Ambulatory Visit
Admission: RE | Admit: 2016-02-22 | Discharge: 2016-02-22 | Disposition: A | Discharge status: Home / Self Care | Payer: BLUE CROSS/BLUE SHIELD | Source: Ambulatory Visit | Attending: Obstetrics and Gynecology | Admitting: Obstetrics and Gynecology

## 2016-02-22 DIAGNOSIS — R928 Other abnormal and inconclusive findings on diagnostic imaging of breast: Secondary | ICD-10-CM

## 2016-03-09 DIAGNOSIS — Z630 Problems in relationship with spouse or partner: Secondary | ICD-10-CM | POA: Diagnosis not present

## 2016-03-09 DIAGNOSIS — F411 Generalized anxiety disorder: Secondary | ICD-10-CM | POA: Diagnosis not present

## 2016-04-27 ENCOUNTER — Ambulatory Visit (INDEPENDENT_AMBULATORY_CARE_PROVIDER_SITE_OTHER): Payer: BLUE CROSS/BLUE SHIELD | Admitting: Physician Assistant

## 2016-04-27 VITALS — BP 114/76 | HR 78 | Temp 98.1°F | Resp 17 | Ht 64.0 in | Wt 141.0 lb

## 2016-04-27 DIAGNOSIS — K219 Gastro-esophageal reflux disease without esophagitis: Secondary | ICD-10-CM

## 2016-04-27 DIAGNOSIS — F411 Generalized anxiety disorder: Secondary | ICD-10-CM | POA: Diagnosis not present

## 2016-04-27 DIAGNOSIS — F418 Other specified anxiety disorders: Secondary | ICD-10-CM

## 2016-04-27 DIAGNOSIS — N3941 Urge incontinence: Secondary | ICD-10-CM | POA: Diagnosis not present

## 2016-04-27 LAB — POCT URINALYSIS DIP (MANUAL ENTRY)
BILIRUBIN UA: NEGATIVE
BILIRUBIN UA: NEGATIVE
Glucose, UA: NEGATIVE
LEUKOCYTES UA: NEGATIVE
Nitrite, UA: NEGATIVE
PH UA: 5.5
Protein Ur, POC: NEGATIVE
Spec Grav, UA: 1.015
Urobilinogen, UA: 0.2

## 2016-04-27 LAB — POC MICROSCOPIC URINALYSIS (UMFC): MUCUS RE: ABSENT

## 2016-04-27 MED ORDER — OMEPRAZOLE 40 MG PO CPDR: DELAYED_RELEASE_CAPSULE | ORAL | 3 refills | Status: DC

## 2016-04-27 MED ORDER — DESVENLAFAXINE SUCCINATE ER 50 MG PO TB24: 50.0000 mg | ORAL_TABLET | Freq: Every day | ORAL | 0 refills | Status: DC

## 2016-04-27 MED ORDER — ALPRAZOLAM 0.25 MG PO TABS: 0.1250 mg | ORAL_TABLET | Freq: Two times a day (BID) | ORAL | 0 refills | Status: DC | PRN

## 2016-04-27 MED ORDER — OXYBUTYNIN CHLORIDE ER 5 MG PO TB24: 5.0000 mg | ORAL_TABLET | Freq: Every day | ORAL | 0 refills | Status: DC

## 2016-04-27 MED ORDER — DESVENLAFAXINE SUCCINATE ER 25 MG PO TB24: 25.0000 mg | ORAL_TABLET | Freq: Every day | ORAL | 0 refills | Status: DC

## 2016-04-27 NOTE — Patient Instructions (Addendum)
Week 1 prozac 100m daily pristiq 244mdaily  Week 2 Prozac 1029maily Pristiq 60m65mily  Week 3 prozac 10mg58mr other day pristiq 60mg 83my  Week 4 pristiq 60mg d95m  Week 5 pristiq 50mg da62m   IF you received an x-ray today, you will receive an invoice from [facility]. Please contact [facility] at [phone] or concerns regarding your invoice.   IF you received labwork today, you will receive an invoice from [facility] contact [facility] at [phone] or concerns regarding your invoice.   Our billing staff will not be able to assist you with questions regarding bills from these companies.  You will be contacted with the lab results as soon as they are available. The fastest way to get your results is to activate your My Chart account. Instructions are located on the last page of this paperwork. If you have not heard from us [facility] the results in 2 weeks, please contact this office.

## 2016-04-27 NOTE — Progress Notes (Signed)
Kristine Lee  MRN: [id number] DOB: [birth year]  Subjective:  Pt presents to clinic for medication refills.  She does not feel like her Prozac is helping .  It was increased about a month ago to 56m for her anxiety which is new for her - years ago when she was put on medication sit was for depression and then within the last year she has become more anxious and when she gets anxious she gets angry and irritable to people around her - she feels like a lot of recent stress.  When she is anxious she feels out of control - which makes her angry - feels smothered by life - then she snaps at those she loves.  Girlfriend and been understanding but she does not want to cause problems in their relationship. She has multiple recent increase stressors pressure at work with transition - being asked to do more and she wants to make sure she is doing a good job and she feels like she cannot with all the tasks that she has to do, looking to buy a home.  She has noticed worsening memory and concentration since all this stress started.  Sleep - no problems  Urinary urgency - couple of months - not getting worse but not getting better - she talked with gyn and they did labs but everything looked ok on labs - she has stopped drinking as much water (used to drink 80oz of water) to hopefully decrease her urination but it has not helped during the day - she is not getting up to urinate at night - she is unsure about vaginal dryness - she is not currently sexually active very much due to the recent stress and working 60 hours a week.  She has not started wearing a pad but she has thought about it.  She will be at work and she gets the urge and she has to stop what she is doing to not have her stream release - she will sometimes have to sit  She has some stress incontinence also but that has not changed.  Review of Systems  Genitourinary: Positive for urgency. Negative for dysuria and frequency.  Psychiatric/Behavioral:  Positive for decreased concentration. Negative for sleep disturbance. The patient is nervous/anxious.     Patient Active Problem List   Diagnosis Date Noted  . Degeneration of cervical intervertebral disc 04/23/2015  . Medial epicondylitis of right elbow 04/23/2015  . Anxiety and depression 04/18/2013    Current Outpatient Prescriptions on File Prior to Visit  Medication Sig Dispense Refill  . cetirizine (ZYRTEC) 10 MG tablet Take 10 mg by mouth daily.    .Marland KitchenFLUoxetine (PROZAC) 20 MG tablet TAKE 1 TABLET (20 MG TOTAL) BY MOUTH DAILY. 90 tablet 1   No current facility-administered medications on file prior to visit.     Allergies  Allergen Reactions  . Biaxin [Clarithromycin] Nausea And Vomiting  . Vancomycin Other (See Comments)    RedMan Syndrome    Pt patients past, family and social history were reviewed and updated.   Objective:  BP 114/76 (BP Location: Right Arm, Patient Position: Sitting, Cuff Size: Normal)   Pulse 78   Temp 98.1 F (36.7 C) (Oral)   Resp 17   Ht 5' 4"  (1.626 m)   Wt 141 lb (64 kg)   SpO2 99%   BMI 24.20 kg/m   Physical Exam  Constitutional: She is oriented to person, place, and time and well-developed, well-nourished, and in no distress.  HENT:  Head: Normocephalic and atraumatic.  Right Ear: Hearing and external ear normal.  Left Ear: Hearing and external ear normal.  Eyes: Conjunctivae are normal.  Neck: Normal range of motion.  Cardiovascular: Normal rate, regular rhythm and normal heart sounds.   No murmur heard. Pulmonary/Chest: Effort normal and breath sounds normal. She has no wheezes.  Abdominal: Soft. Bowel sounds are normal. There is no CVA tenderness.  Neurological: She is alert and oriented to person, place, and time. Gait normal.  Skin: Skin is warm and dry.  Psychiatric: Mood, memory, affect and judgment normal.  Vitals reviewed.  Results for orders placed or performed in visit on 04/27/16  POCT urinalysis dipstick  Result  Value Ref Range   Color, UA yellow yellow   Clarity, UA clear clear   Glucose, UA negative negative   Bilirubin, UA negative negative   Ketones, POC UA negative negative   Spec Grav, UA 1.015    Blood, UA trace-intact (A) negative   pH, UA 5.5    Protein Ur, POC negative negative   Urobilinogen, UA 0.2    Nitrite, UA Negative Negative   Leukocytes, UA Negative Negative  POCT Microscopic Urinalysis ([facility])  Result Value Ref Range   WBC,UR,HPF,POC None None WBC/hpf   RBC,UR,HPF,POC None None RBC/hpf   Bacteria None None, Too numerous to count   Mucus Absent Absent   Epithelial Cells, UR Per Microscopy Few (A) None, Too numerous to count cells/hpf    Assessment and Plan :  Anxiety state - Plan: Desvenlafaxine Succinate ER (PRISTIQ) 25 MG TB24, desvenlafaxine (PRISTIQ) 50 MG 24 hr tablet - titrate down prozac - titrate up Pristiq over the next month - recheck in 6 weeks  Gastroesophageal reflux disease without esophagitis - Plan: omeprazole (PRILOSEC) 40 MG capsule - refill pain medication  Urge incontinence - Plan: POCT urinalysis dipstick, POCT Microscopic Urinalysis ([facility]), oxybutynin (DITROPAN-XL) 5 MG 24 hr tablet - trial   Depression with anxiety - Plan: ALPRAZolam (XANAX) 0.25 MG tablet - rare use - refilled medications  Windell Hummingbird PA-C  [facility] 04/29/2016 7:59 PM

## 2016-05-15 ENCOUNTER — Encounter: Payer: Self-pay | Admitting: Physician Assistant

## 2016-05-15 DIAGNOSIS — Z1329 Encounter for screening for other suspected endocrine disorder: Secondary | ICD-10-CM

## 2016-05-15 DIAGNOSIS — R1084 Generalized abdominal pain: Secondary | ICD-10-CM

## 2016-05-15 DIAGNOSIS — Z1321 Encounter for screening for nutritional disorder: Secondary | ICD-10-CM

## 2016-05-15 DIAGNOSIS — Z13228 Encounter for screening for other metabolic disorders: Secondary | ICD-10-CM

## 2016-05-15 DIAGNOSIS — Z13220 Encounter for screening for lipoid disorders: Secondary | ICD-10-CM

## 2016-05-15 DIAGNOSIS — Z130 Encounter for screening for diseases of the blood and blood-forming organs and certain disorders involving the immune mechanism: Secondary | ICD-10-CM

## 2016-05-21 ENCOUNTER — Other Ambulatory Visit: Payer: Self-pay | Admitting: Physician Assistant

## 2016-05-21 DIAGNOSIS — N3941 Urge incontinence: Secondary | ICD-10-CM

## 2016-05-21 NOTE — Telephone Encounter (Signed)
Kristine Lee, you stated a "trial" of the Ditropan and in the last [place] email pt stated it was working.  Do you want to refill ?

## 2016-05-24 ENCOUNTER — Other Ambulatory Visit: Payer: Self-pay | Admitting: Physician Assistant

## 2016-05-24 NOTE — Addendum Note (Signed)
Addended by: Mancel Bale on: 05/24/2016 01:01 PM   Modules accepted: Orders

## 2016-05-26 NOTE — Telephone Encounter (Signed)
Done

## 2016-05-28 ENCOUNTER — Other Ambulatory Visit: Payer: Self-pay | Admitting: Physician Assistant

## 2016-05-28 DIAGNOSIS — Z130 Encounter for screening for diseases of the blood and blood-forming organs and certain disorders involving the immune mechanism: Secondary | ICD-10-CM | POA: Diagnosis not present

## 2016-05-28 DIAGNOSIS — R1084 Generalized abdominal pain: Secondary | ICD-10-CM | POA: Diagnosis not present

## 2016-05-28 DIAGNOSIS — Z13220 Encounter for screening for lipoid disorders: Secondary | ICD-10-CM | POA: Diagnosis not present

## 2016-05-28 DIAGNOSIS — Z1321 Encounter for screening for nutritional disorder: Secondary | ICD-10-CM | POA: Diagnosis not present

## 2016-05-28 LAB — LIPID PANEL
CHOL/HDL RATIO: 3.6 ratio (ref <5.0)
Cholesterol: 243 mg/dL — ABNORMAL HIGH (ref <200)
HDL: 67 mg/dL (ref >50)
LDL CALC: 155 mg/dL — AB (ref <100)
TRIGLYCERIDES: 103 mg/dL (ref <150)
VLDL: 21 mg/dL (ref <30)

## 2016-05-28 LAB — COMPREHENSIVE METABOLIC PANEL
ALT: 22 U/L (ref 6–29)
AST: 23 U/L (ref 10–35)
Albumin: 4.3 g/dL (ref 3.6–5.1)
Alkaline Phosphatase: 88 U/L (ref 33–115)
BILIRUBIN TOTAL: 0.4 mg/dL (ref 0.2–1.2)
BUN: 12 mg/dL (ref 7–25)
CALCIUM: 9.5 mg/dL (ref 8.6–10.2)
CHLORIDE: 99 mmol/L (ref 98–110)
CO2: 25 mmol/L (ref 20–31)
Creat: 0.83 mg/dL (ref 0.50–1.10)
GLUCOSE: 95 mg/dL (ref 65–99)
Potassium: 4.7 mmol/L (ref 3.5–5.3)
Sodium: 135 mmol/L (ref 135–146)
Total Protein: 7.6 g/dL (ref 6.1–8.1)

## 2016-05-28 LAB — CBC
HEMATOCRIT: 41.4 % (ref 35.0–45.0)
Hemoglobin: 13.7 g/dL (ref 11.7–15.5)
MCH: 29.3 pg (ref 27.0–33.0)
MCHC: 33.1 g/dL (ref 32.0–36.0)
MCV: 88.7 fL (ref 80.0–100.0)
MPV: 9.9 fL (ref 7.5–12.5)
Platelets: 265 10*3/uL (ref 140–400)
RBC: 4.67 MIL/uL (ref 3.80–5.10)
RDW: 13.8 % (ref 11.0–15.0)
WBC: 5.4 10*3/uL (ref 3.8–10.8)

## 2016-05-28 LAB — FERRITIN: Ferritin: 17 ng/mL (ref 10–232)

## 2016-05-28 LAB — TSH: TSH: 3.69 m[IU]/L

## 2016-05-30 LAB — VITAMIN D 25 HYDROXY (VIT D DEFICIENCY, FRACTURES): VIT D 25 HYDROXY: 39 ng/mL (ref 30–100)

## 2016-05-30 LAB — TISSUE TRANSGLUTAMINASE, IGA: Tissue Transglutaminase Ab, IgA: 1 U/mL (ref <4)

## 2016-06-09 ENCOUNTER — Encounter: Payer: Self-pay | Admitting: Physician Assistant

## 2016-06-09 ENCOUNTER — Ambulatory Visit (INDEPENDENT_AMBULATORY_CARE_PROVIDER_SITE_OTHER): Payer: BLUE CROSS/BLUE SHIELD | Admitting: Physician Assistant

## 2016-06-09 DIAGNOSIS — N3941 Urge incontinence: Secondary | ICD-10-CM | POA: Diagnosis not present

## 2016-06-09 DIAGNOSIS — K219 Gastro-esophageal reflux disease without esophagitis: Secondary | ICD-10-CM | POA: Diagnosis not present

## 2016-06-09 DIAGNOSIS — F411 Generalized anxiety disorder: Secondary | ICD-10-CM | POA: Diagnosis not present

## 2016-06-09 MED ORDER — OXYBUTYNIN CHLORIDE ER 5 MG PO TB24: 5.0000 mg | ORAL_TABLET | Freq: Every day | ORAL | 1 refills | Status: DC

## 2016-06-09 MED ORDER — DESVENLAFAXINE SUCCINATE ER 50 MG PO TB24: 50.0000 mg | ORAL_TABLET | Freq: Every day | ORAL | 1 refills | Status: DC

## 2016-06-09 NOTE — Progress Notes (Signed)
   Kristine Lee  MRN: [id number] DOB: 02-04-1971  Subjective:  Pt presents to clinic for medication recheck.  She is doing really well on the Pristiq.  She is less irritable and her partner has noticed a difference.  She is happy with the results and wants to stay with her current dose.  She tried to stop her Prilosec and it was immediate return of her heartburn.  She has family history of elevated cholesterol - she eats healthy - fish most nights and chicken the other nights - no red meat.  She eats almost no gluten because it makes her feel terrible.  Review of Systems  Constitutional: Negative for chills and fever.    Patient Active Problem List   Diagnosis Date Noted  . GERD (gastroesophageal reflux disease) 06/09/2016  . Urinary urgency 06/09/2016  . Degeneration of cervical intervertebral disc 04/23/2015  . Medial epicondylitis of right elbow 04/23/2015  . Anxiety and depression 04/18/2013    Current Outpatient Prescriptions on File Prior to Visit  Medication Sig Dispense Refill  . ALPRAZolam (XANAX) 0.25 MG tablet Take 0.5-1 tablets (0.125-0.25 mg total) by mouth 2 (two) times daily as needed for anxiety. 20 tablet 0  . cetirizine (ZYRTEC) 10 MG tablet Take 10 mg by mouth daily.    Marland Kitchen omeprazole (PRILOSEC) 40 MG capsule TAKE ONE CAPSULE BY MOUTH EVERY DAY 90 capsule 3   No current facility-administered medications on file prior to visit.     Allergies  Allergen Reactions  . Biaxin [Clarithromycin] Nausea And Vomiting  . Vancomycin Other (See Comments)    RedMan Syndrome    Pt patients past, family and social history were reviewed and updated.   Objective:  BP 112/67   Pulse 69   Temp 98 F (36.7 C) (Oral)   Resp 16   Ht 5' 4"  (1.626 m)   Wt 145 lb (65.8 kg)   SpO2 98%   BMI 24.89 kg/m   Physical Exam  Constitutional: She is oriented to person, place, and time and well-developed, well-nourished, and in no distress.  HENT:  Head: Normocephalic and  atraumatic.  Right Ear: Hearing and external ear normal.  Left Ear: Hearing and external ear normal.  Eyes: Conjunctivae are normal.  Neck: Normal range of motion.  Pulmonary/Chest: Effort normal.  Neurological: She is alert and oriented to person, place, and time. Gait normal.  Skin: Skin is warm and dry.  Psychiatric: Mood, memory, affect and judgment normal.  Vitals reviewed.  Assessment and Plan :  Urge incontinence - Plan: oxybutynin (DITROPAN-XL) 5 MG 24 hr tablet - controlled on medications - pt to use as she wants relief  Anxiety state - Plan: desvenlafaxine (PRISTIQ) 50 MG 24 hr tablet - much improved on medications  Gastroesophageal reflux disease without esophagitis - continue current medications   Windell Hummingbird PA-C  [facility] 06/09/2016 7:47 PM

## 2016-06-09 NOTE — Patient Instructions (Signed)
     IF you received an x-ray today, you will receive an invoice from [facility]. Please contact [facility] at [phone] with questions or concerns regarding your invoice.   IF you received labwork today, you will receive an invoice from [facility]. Please contact [facility] at [phone] with questions or concerns regarding your invoice.   Our billing staff will not be able to assist you with questions regarding bills from these companies.  You will be contacted with the lab results as soon as they are available. The fastest way to get your results is to activate your My Chart account. Instructions are located on the last page of this paperwork. If you have not heard from us regarding the results in 2 weeks, please contact this office.     

## 2016-07-18 DIAGNOSIS — H5213 Myopia, bilateral: Secondary | ICD-10-CM | POA: Diagnosis not present

## 2016-07-18 DIAGNOSIS — H40003 Preglaucoma, unspecified, bilateral: Secondary | ICD-10-CM | POA: Diagnosis not present

## 2016-07-18 DIAGNOSIS — H524 Presbyopia: Secondary | ICD-10-CM | POA: Diagnosis not present

## 2016-08-03 DIAGNOSIS — H40003 Preglaucoma, unspecified, bilateral: Secondary | ICD-10-CM | POA: Diagnosis not present

## 2016-08-04 DIAGNOSIS — H40003 Preglaucoma, unspecified, bilateral: Secondary | ICD-10-CM | POA: Diagnosis not present

## 2016-08-08 ENCOUNTER — Ambulatory Visit (INDEPENDENT_AMBULATORY_CARE_PROVIDER_SITE_OTHER): Payer: BLUE CROSS/BLUE SHIELD | Admitting: Psychiatry

## 2016-08-08 ENCOUNTER — Encounter (HOSPITAL_COMMUNITY): Payer: Self-pay | Admitting: Psychiatry

## 2016-08-08 VITALS — BP 114/70 | HR 69 | Ht 64.0 in | Wt 146.2 lb

## 2016-08-08 DIAGNOSIS — F321 Major depressive disorder, single episode, moderate: Secondary | ICD-10-CM | POA: Diagnosis not present

## 2016-08-08 DIAGNOSIS — F419 Anxiety disorder, unspecified: Secondary | ICD-10-CM

## 2016-08-08 DIAGNOSIS — Z79899 Other long term (current) drug therapy: Secondary | ICD-10-CM | POA: Diagnosis not present

## 2016-08-08 DIAGNOSIS — Z818 Family history of other mental and behavioral disorders: Secondary | ICD-10-CM | POA: Diagnosis not present

## 2016-08-08 DIAGNOSIS — Z888 Allergy status to other drugs, medicaments and biological substances status: Secondary | ICD-10-CM | POA: Diagnosis not present

## 2016-08-08 MED ORDER — ARIPIPRAZOLE 2 MG PO TABS: 2.0000 mg | ORAL_TABLET | Freq: Every day | ORAL | 0 refills | Status: DC
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
## 2016-08-15 DIAGNOSIS — H40003 Preglaucoma, unspecified, bilateral: Secondary | ICD-10-CM | POA: Diagnosis not present

## 2016-08-18 ENCOUNTER — Encounter (HOSPITAL_COMMUNITY): Payer: Self-pay | Admitting: Psychiatry

## 2016-08-18 ENCOUNTER — Other Ambulatory Visit (HOSPITAL_COMMUNITY): Payer: BLUE CROSS/BLUE SHIELD | Attending: Psychiatry | Admitting: Psychiatry

## 2016-08-18 DIAGNOSIS — M1990 Unspecified osteoarthritis, unspecified site: Secondary | ICD-10-CM | POA: Insufficient documentation

## 2016-08-18 DIAGNOSIS — K219 Gastro-esophageal reflux disease without esophagitis: Secondary | ICD-10-CM | POA: Diagnosis not present

## 2016-08-18 DIAGNOSIS — Z881 Allergy status to other antibiotic agents status: Secondary | ICD-10-CM | POA: Diagnosis not present

## 2016-08-18 DIAGNOSIS — E785 Hyperlipidemia, unspecified: Secondary | ICD-10-CM | POA: Diagnosis not present

## 2016-08-18 DIAGNOSIS — F419 Anxiety disorder, unspecified: Secondary | ICD-10-CM | POA: Diagnosis not present

## 2016-08-18 DIAGNOSIS — F331 Major depressive disorder, recurrent, moderate: Secondary | ICD-10-CM | POA: Insufficient documentation

## 2016-08-18 DIAGNOSIS — F321 Major depressive disorder, single episode, moderate: Secondary | ICD-10-CM
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
## 2016-08-19 ENCOUNTER — Other Ambulatory Visit (HOSPITAL_COMMUNITY): Payer: BLUE CROSS/BLUE SHIELD | Admitting: Psychiatry

## 2016-08-19 DIAGNOSIS — Z881 Allergy status to other antibiotic agents status: Secondary | ICD-10-CM | POA: Diagnosis not present

## 2016-08-19 DIAGNOSIS — M1990 Unspecified osteoarthritis, unspecified site: Secondary | ICD-10-CM | POA: Diagnosis not present

## 2016-08-19 DIAGNOSIS — E785 Hyperlipidemia, unspecified: Secondary | ICD-10-CM | POA: Diagnosis not present

## 2016-08-19 DIAGNOSIS — F419 Anxiety disorder, unspecified: Secondary | ICD-10-CM | POA: Diagnosis not present

## 2016-08-19 DIAGNOSIS — F331 Major depressive disorder, recurrent, moderate: Secondary | ICD-10-CM | POA: Diagnosis not present

## 2016-08-19 DIAGNOSIS — F321 Major depressive disorder, single episode, moderate: Secondary | ICD-10-CM

## 2016-08-19 DIAGNOSIS — K219 Gastro-esophageal reflux disease without esophagitis: Secondary | ICD-10-CM | POA: Diagnosis not present

## 2016-08-22 ENCOUNTER — Other Ambulatory Visit (HOSPITAL_COMMUNITY): Payer: BLUE CROSS/BLUE SHIELD | Admitting: Psychiatry

## 2016-08-22 ENCOUNTER — Telehealth: Payer: Self-pay | Admitting: Family Medicine

## 2016-08-22 DIAGNOSIS — M1990 Unspecified osteoarthritis, unspecified site: Secondary | ICD-10-CM | POA: Diagnosis not present

## 2016-08-22 DIAGNOSIS — F419 Anxiety disorder, unspecified: Secondary | ICD-10-CM | POA: Diagnosis not present

## 2016-08-22 DIAGNOSIS — F331 Major depressive disorder, recurrent, moderate: Secondary | ICD-10-CM | POA: Diagnosis not present

## 2016-08-22 DIAGNOSIS — Z881 Allergy status to other antibiotic agents status: Secondary | ICD-10-CM | POA: Diagnosis not present

## 2016-08-22 DIAGNOSIS — F321 Major depressive disorder, single episode, moderate: Secondary | ICD-10-CM

## 2016-08-22 DIAGNOSIS — K219 Gastro-esophageal reflux disease without esophagitis: Secondary | ICD-10-CM | POA: Diagnosis not present

## 2016-08-22 DIAGNOSIS — E785 Hyperlipidemia, unspecified: Secondary | ICD-10-CM | POA: Diagnosis not present

## 2016-08-22 NOTE — Telephone Encounter (Signed)
Renita from behavior health wanted [facility] to know that pt has started treatment there

## 2016-08-22 NOTE — Progress Notes (Signed)
    Daily Group Progress Note  Program: IOP  Group Time: 9:00-12:00   Participation Level: active   Behavioral Response: engaged   Type of Therapy:   group therapy   Summary of Progress: Today was Pt.'s first day in group and she stated that it was hard to speak in front of a group.  She shared that she would like to observe the first day.  Nancie Neas, LPC

## 2016-08-23 ENCOUNTER — Other Ambulatory Visit (HOSPITAL_COMMUNITY): Payer: BLUE CROSS/BLUE SHIELD | Admitting: Psychiatry

## 2016-08-23 DIAGNOSIS — K219 Gastro-esophageal reflux disease without esophagitis: Secondary | ICD-10-CM | POA: Diagnosis not present

## 2016-08-23 DIAGNOSIS — M1990 Unspecified osteoarthritis, unspecified site: Secondary | ICD-10-CM | POA: Diagnosis not present

## 2016-08-23 DIAGNOSIS — F321 Major depressive disorder, single episode, moderate: Secondary | ICD-10-CM

## 2016-08-23 DIAGNOSIS — Z881 Allergy status to other antibiotic agents status: Secondary | ICD-10-CM | POA: Diagnosis not present

## 2016-08-23 DIAGNOSIS — E785 Hyperlipidemia, unspecified: Secondary | ICD-10-CM | POA: Diagnosis not present

## 2016-08-23 DIAGNOSIS — F331 Major depressive disorder, recurrent, moderate: Secondary | ICD-10-CM | POA: Diagnosis not present

## 2016-08-23 DIAGNOSIS — F419 Anxiety disorder, unspecified: Secondary | ICD-10-CM | POA: Diagnosis not present
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
## 2016-08-23 NOTE — Telephone Encounter (Signed)
Noted  

## 2016-08-23 NOTE — Progress Notes (Signed)
    Daily Group Progress Note  Program: IOP  Group Time: 9:00-12:00  Participation Level: Active  Behavioral Response: Appropriate  Type of Therapy:  Group Therapy  Summary of Progress: Pt. Presents as talkative, engaged in the therapeutic process. Pt. Discussed generally feeling disconnected from her feelings and not knowing how she was feeling from moment to moment. Pt. Was able to connect with the feelings of anger and resentment and related to too often extending herself in work and personal relationships. Pt. Participated in discussion about using the feelings wheel to identify emotions.    Nancie Neas, LPC

## 2016-08-24 ENCOUNTER — Other Ambulatory Visit (HOSPITAL_COMMUNITY): Payer: BLUE CROSS/BLUE SHIELD

## 2016-08-24 NOTE — Progress Notes (Signed)
    Daily Group Progress Note  Program: IOP  Group Time: 9:00-12:00   Participation Level:  active   Behavioral Response:  responsive   Type of Therapy: group therapy  Summary of Progress: Pt reported having a good weekend away with her partner. They had a number of good talks about needs and boundaries, however she noted that when they returned home her partner seemed to quickly slip back into her old behavior. Pt is hopeful about her relationship, and plans to continue setting the boundaries she needs. Pt. Participated in medication education group.  Nancie Neas, LPC
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
## 2016-08-25 ENCOUNTER — Other Ambulatory Visit (HOSPITAL_COMMUNITY): Payer: BLUE CROSS/BLUE SHIELD | Admitting: Psychiatry

## 2016-08-25 DIAGNOSIS — Z881 Allergy status to other antibiotic agents status: Secondary | ICD-10-CM | POA: Diagnosis not present

## 2016-08-25 DIAGNOSIS — F321 Major depressive disorder, single episode, moderate: Secondary | ICD-10-CM

## 2016-08-25 DIAGNOSIS — K219 Gastro-esophageal reflux disease without esophagitis: Secondary | ICD-10-CM | POA: Diagnosis not present

## 2016-08-25 DIAGNOSIS — F419 Anxiety disorder, unspecified: Secondary | ICD-10-CM | POA: Diagnosis not present

## 2016-08-25 DIAGNOSIS — M1990 Unspecified osteoarthritis, unspecified site: Secondary | ICD-10-CM | POA: Diagnosis not present

## 2016-08-25 DIAGNOSIS — F331 Major depressive disorder, recurrent, moderate: Secondary | ICD-10-CM | POA: Diagnosis not present

## 2016-08-25 DIAGNOSIS — E785 Hyperlipidemia, unspecified: Secondary | ICD-10-CM | POA: Diagnosis not present

## 2016-08-26 ENCOUNTER — Other Ambulatory Visit (HOSPITAL_COMMUNITY): Payer: BLUE CROSS/BLUE SHIELD | Admitting: Psychiatry

## 2016-08-26 DIAGNOSIS — E785 Hyperlipidemia, unspecified: Secondary | ICD-10-CM | POA: Diagnosis not present

## 2016-08-26 DIAGNOSIS — K219 Gastro-esophageal reflux disease without esophagitis: Secondary | ICD-10-CM | POA: Diagnosis not present

## 2016-08-26 DIAGNOSIS — F321 Major depressive disorder, single episode, moderate: Secondary | ICD-10-CM

## 2016-08-26 DIAGNOSIS — F419 Anxiety disorder, unspecified: Secondary | ICD-10-CM | POA: Diagnosis not present

## 2016-08-26 DIAGNOSIS — M1990 Unspecified osteoarthritis, unspecified site: Secondary | ICD-10-CM | POA: Diagnosis not present

## 2016-08-26 DIAGNOSIS — F331 Major depressive disorder, recurrent, moderate: Secondary | ICD-10-CM | POA: Diagnosis not present

## 2016-08-26 DIAGNOSIS — Z881 Allergy status to other antibiotic agents status: Secondary | ICD-10-CM | POA: Diagnosis not present

## 2016-08-29 ENCOUNTER — Other Ambulatory Visit (HOSPITAL_COMMUNITY): Payer: BLUE CROSS/BLUE SHIELD | Admitting: Psychiatry

## 2016-08-29 DIAGNOSIS — M1990 Unspecified osteoarthritis, unspecified site: Secondary | ICD-10-CM | POA: Diagnosis not present

## 2016-08-29 DIAGNOSIS — F419 Anxiety disorder, unspecified: Secondary | ICD-10-CM | POA: Diagnosis not present

## 2016-08-29 DIAGNOSIS — F321 Major depressive disorder, single episode, moderate: Secondary | ICD-10-CM

## 2016-08-29 DIAGNOSIS — E785 Hyperlipidemia, unspecified: Secondary | ICD-10-CM | POA: Diagnosis not present

## 2016-08-29 DIAGNOSIS — Z881 Allergy status to other antibiotic agents status: Secondary | ICD-10-CM | POA: Diagnosis not present

## 2016-08-29 DIAGNOSIS — F331 Major depressive disorder, recurrent, moderate: Secondary | ICD-10-CM | POA: Diagnosis not present

## 2016-08-29 DIAGNOSIS — K219 Gastro-esophageal reflux disease without esophagitis: Secondary | ICD-10-CM | POA: Diagnosis not present

## 2016-08-30 ENCOUNTER — Other Ambulatory Visit (HOSPITAL_COMMUNITY): Payer: BLUE CROSS/BLUE SHIELD | Admitting: Psychiatry

## 2016-08-30 DIAGNOSIS — F321 Major depressive disorder, single episode, moderate: Secondary | ICD-10-CM

## 2016-08-30 DIAGNOSIS — M1990 Unspecified osteoarthritis, unspecified site: Secondary | ICD-10-CM | POA: Diagnosis not present

## 2016-08-30 DIAGNOSIS — F331 Major depressive disorder, recurrent, moderate: Secondary | ICD-10-CM | POA: Diagnosis not present

## 2016-08-30 DIAGNOSIS — Z881 Allergy status to other antibiotic agents status: Secondary | ICD-10-CM | POA: Diagnosis not present

## 2016-08-30 DIAGNOSIS — E785 Hyperlipidemia, unspecified: Secondary | ICD-10-CM | POA: Diagnosis not present

## 2016-08-30 DIAGNOSIS — F419 Anxiety disorder, unspecified: Secondary | ICD-10-CM | POA: Diagnosis not present

## 2016-08-30 DIAGNOSIS — K219 Gastro-esophageal reflux disease without esophagitis: Secondary | ICD-10-CM | POA: Diagnosis not present

## 2016-08-30 MED ORDER — ARIPIPRAZOLE 2 MG PO TABS: 2.0000 mg | ORAL_TABLET | Freq: Every day | ORAL | 0 refills | Status: DC

## 2016-08-30 MED ORDER — ARIPIPRAZOLE 2 MG PO TABS: 2.0000 mg | ORAL_TABLET | Freq: Every day | ORAL | 2 refills | Status: DC

## 2016-08-31 ENCOUNTER — Other Ambulatory Visit (HOSPITAL_COMMUNITY): Payer: BLUE CROSS/BLUE SHIELD | Admitting: Psychiatry

## 2016-08-31 DIAGNOSIS — K219 Gastro-esophageal reflux disease without esophagitis: Secondary | ICD-10-CM | POA: Diagnosis not present

## 2016-08-31 DIAGNOSIS — Z881 Allergy status to other antibiotic agents status: Secondary | ICD-10-CM | POA: Diagnosis not present

## 2016-08-31 DIAGNOSIS — F331 Major depressive disorder, recurrent, moderate: Secondary | ICD-10-CM | POA: Diagnosis not present

## 2016-08-31 DIAGNOSIS — M1990 Unspecified osteoarthritis, unspecified site: Secondary | ICD-10-CM | POA: Diagnosis not present

## 2016-08-31 DIAGNOSIS — F419 Anxiety disorder, unspecified: Secondary | ICD-10-CM | POA: Diagnosis not present

## 2016-08-31 DIAGNOSIS — E785 Hyperlipidemia, unspecified: Secondary | ICD-10-CM | POA: Diagnosis not present

## 2016-08-31 NOTE — Progress Notes (Signed)
    Daily Group Progress Note  Program: IOP  Group Time: 9:00-12:00  Participation Level: Active  Behavioral Response: Appropriate  Type of Therapy:  Group Therapy  Summary of Progress: Pt. Presents with bright affect, talkative, engaged in the group process. Pt. Continues to work on developing her unintentional and intentional cognitive models. Pt. Processed recent interaction with her spouse and worked on developing a model for how she can respond more positively. Pt. Participated in discussion facilitated by the wellness instructor.     Nancie Neas, LPC

## 2016-08-31 NOTE — Progress Notes (Signed)
    Daily Group Progress Note  Program: IOP  Group Time: 9:00-12:00   Participation Level: active   Behavioral Response: engaged   Type of Therapy:   group therapy  Summary of Progress: Pt reported struggling with conflict and anxiety over the weekend. Her mother, who tends to make passive homophobic remarks, was a large trigger for her anxiety. This led to a conflict between herself and her partner. She engaged with the group in a conversation about needs, however she expressed that her issue is she pretends she doesn't have needs. Discussions around communications and boundaries ensued. Pt. Participated in grief and loss group facilitated by the Chaplain.  Nancie Neas, LPC

## 2016-08-31 NOTE — Progress Notes (Signed)
    Daily Group Progress Note  Program: IOP  Group Time: 9:00-12:00   Participation Level:  active   Behavioral Response: engaged   Type of Therapy:   group therapy  Summary of Progress: Pt reported having a good weekend. She and her partner were communicating in a healthier new way, and thus avoided any conflicts. However, pt did become upset with her mother when she felt forgotten by her. Pt was upset with herself for this angry outburst. The group explored her tendency to get angry with her mother using the unintentional model. The Pharmacist came to speak to group.  Nancie Neas, LPC

## 2016-08-31 NOTE — Progress Notes (Signed)
    Daily Group Progress Note  Program: IOP  Group Time: 9:00-12:00   Participation Level:  active   Behavioral Response: engaged   Type of Therapy:  group therapy   Summary of Progress: Pt. had been up late the night before at work.  She works third shift and had an extra training so she had a lot of caffeine in order to keep herself up.  She was able to sleep but not as long as she would have liked.  Pt. connected with the mindfulness techniques we talked about today and seemed to appreciate the application and directedness of the mindfulness handout.  Nancie Neas, LPC

## 2016-09-01 ENCOUNTER — Ambulatory Visit (INDEPENDENT_AMBULATORY_CARE_PROVIDER_SITE_OTHER): Payer: BLUE CROSS/BLUE SHIELD

## 2016-09-01 ENCOUNTER — Encounter: Payer: Self-pay | Admitting: Podiatry

## 2016-09-01 ENCOUNTER — Other Ambulatory Visit (HOSPITAL_COMMUNITY): Payer: BLUE CROSS/BLUE SHIELD | Admitting: Psychiatry

## 2016-09-01 ENCOUNTER — Ambulatory Visit (INDEPENDENT_AMBULATORY_CARE_PROVIDER_SITE_OTHER): Payer: BLUE CROSS/BLUE SHIELD | Admitting: Podiatry

## 2016-09-01 DIAGNOSIS — M722 Plantar fascial fibromatosis: Secondary | ICD-10-CM

## 2016-09-01 DIAGNOSIS — K219 Gastro-esophageal reflux disease without esophagitis: Secondary | ICD-10-CM | POA: Diagnosis not present

## 2016-09-01 DIAGNOSIS — Z881 Allergy status to other antibiotic agents status: Secondary | ICD-10-CM | POA: Diagnosis not present

## 2016-09-01 DIAGNOSIS — F321 Major depressive disorder, single episode, moderate: Secondary | ICD-10-CM

## 2016-09-01 DIAGNOSIS — E785 Hyperlipidemia, unspecified: Secondary | ICD-10-CM | POA: Diagnosis not present

## 2016-09-01 DIAGNOSIS — M1990 Unspecified osteoarthritis, unspecified site: Secondary | ICD-10-CM | POA: Diagnosis not present

## 2016-09-01 DIAGNOSIS — F331 Major depressive disorder, recurrent, moderate: Secondary | ICD-10-CM | POA: Diagnosis not present

## 2016-09-01 DIAGNOSIS — F419 Anxiety disorder, unspecified: Secondary | ICD-10-CM | POA: Diagnosis not present

## 2016-09-01 MED ORDER — MELOXICAM 15 MG PO TABS: 15.0000 mg | ORAL_TABLET | Freq: Every day | ORAL | 3 refills | Status: DC

## 2016-09-01 MED ORDER — METHYLPREDNISOLONE 4 MG PO TBPK
ORAL_TABLET | ORAL | 0 refills | Status: DC
Start: 2016-09-01 — End: 2016-09-22

## 2016-09-01 NOTE — Progress Notes (Signed)
    Daily Group Progress Note  Program: IOP  Group Time: 9:00-12:00   Participation Level:  active   Behavioral Response: engaged   Type of Therapy: group therapy  Summary of Progress: Pt reported that she's been working so much, she hasn't had much time at home. She referred to herself as a "workaholic". She shared with the group her homework which was doing an unintentional model on her own. The group gave feedback and processed her results.   Nancie Neas, LPC

## 2016-09-01 NOTE — Patient Instructions (Signed)

## 2016-09-01 NOTE — Progress Notes (Signed)
Brendan presents today with a chief complaint of pain to the right heel times the past several months. She denies any trauma.  Objective: Vital signs are stable she is alert and oriented 3. Pulses are palpable. Neurologic sensory was intact. Deep tendon reflexes are intact. She has pain on palpation medial calcaneal tubercle of the right heel. Radiographs taken today to history of plantar distally oriented calcaneal spur with soft tissue increase in density at the plantar fascia insertion site. No open lesions or wounds are noted on cutaneous evaluation.  Assessment: Plantar fasciitis right foot.  Plan: Dispensed a plantar fascia brace and a night splint. Discussed appropriate shoe gear stretching in size and ice therapy. Start her on a Medrol Dosepak to be followed by meloxicam. No injection was performed today.

## 2016-09-02 ENCOUNTER — Other Ambulatory Visit (HOSPITAL_COMMUNITY): Payer: BLUE CROSS/BLUE SHIELD | Admitting: Psychiatry

## 2016-09-02 DIAGNOSIS — F321 Major depressive disorder, single episode, moderate: Secondary | ICD-10-CM

## 2016-09-02 DIAGNOSIS — Z881 Allergy status to other antibiotic agents status: Secondary | ICD-10-CM | POA: Diagnosis not present

## 2016-09-02 DIAGNOSIS — K219 Gastro-esophageal reflux disease without esophagitis: Secondary | ICD-10-CM | POA: Diagnosis not present

## 2016-09-02 DIAGNOSIS — E785 Hyperlipidemia, unspecified: Secondary | ICD-10-CM | POA: Diagnosis not present

## 2016-09-02 DIAGNOSIS — M1990 Unspecified osteoarthritis, unspecified site: Secondary | ICD-10-CM | POA: Diagnosis not present

## 2016-09-02 DIAGNOSIS — F419 Anxiety disorder, unspecified: Secondary | ICD-10-CM | POA: Diagnosis not present

## 2016-09-02 DIAGNOSIS — F331 Major depressive disorder, recurrent, moderate: Secondary | ICD-10-CM | POA: Diagnosis not present

## 2016-09-05 ENCOUNTER — Other Ambulatory Visit (HOSPITAL_COMMUNITY): Payer: BLUE CROSS/BLUE SHIELD | Admitting: Psychiatry

## 2016-09-05 DIAGNOSIS — K219 Gastro-esophageal reflux disease without esophagitis: Secondary | ICD-10-CM | POA: Diagnosis not present

## 2016-09-05 DIAGNOSIS — Z881 Allergy status to other antibiotic agents status: Secondary | ICD-10-CM | POA: Diagnosis not present

## 2016-09-05 DIAGNOSIS — F321 Major depressive disorder, single episode, moderate: Secondary | ICD-10-CM

## 2016-09-05 DIAGNOSIS — F331 Major depressive disorder, recurrent, moderate: Secondary | ICD-10-CM | POA: Diagnosis not present

## 2016-09-05 DIAGNOSIS — F419 Anxiety disorder, unspecified: Secondary | ICD-10-CM | POA: Diagnosis not present

## 2016-09-05 DIAGNOSIS — M1990 Unspecified osteoarthritis, unspecified site: Secondary | ICD-10-CM | POA: Diagnosis not present

## 2016-09-05 DIAGNOSIS — E785 Hyperlipidemia, unspecified: Secondary | ICD-10-CM | POA: Diagnosis not present

## 2016-09-05 NOTE — Patient Instructions (Signed)
D:  Pt completed MH-IOP today.  Will follow up with Dr. Adele Schilder on 09-08-16 @ 11:30 a.m and Binnie Rail, LCAS on 09-19-16 @ 1 pm.  A:  D/C patient today.  R:  Pt receptive.
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
## 2016-09-05 NOTE — Discharge Summary (Signed)
D:  This is a 46 yr old, Caucasian, employed female who was referred to MH-IOP by Dr. Adele Schilder; treatment for worsening depressive symptoms.  Denies SI/HI or A/V hallucinations.  Pt has completed MH-IOP today.  Attended 08-18-16 thru today.  Reports overall mood improving; able to express needs more at home.  C/O poor concentration.  Pt is journaling and participating in activities more now.  A:  D/C today.  F/U with Dr. Adele Schilder on 09-08-16 @ 1130 and [address], LCAS on 09-19-16 @ 1pm.  Encouraged support groups.  Recommended [facility] with Beth to pt.  Pt is very interested in the group.  R:  Pt receptive.     Dellia Nims, M.Ed, CNA

## 2016-09-06 ENCOUNTER — Other Ambulatory Visit (HOSPITAL_COMMUNITY): Payer: BLUE CROSS/BLUE SHIELD

## 2016-09-07 ENCOUNTER — Other Ambulatory Visit (HOSPITAL_COMMUNITY): Payer: BLUE CROSS/BLUE SHIELD

## 2016-09-07 NOTE — Progress Notes (Signed)
    Daily Group Progress Note  Program: IOP  Group Time: 9:00-12:00   Participation Level:  active   Behavioral Response: Appropriate engaged   Type of Therapy:  Group Therapy group therapy   Summary of Progress: Pt. Participated in medication management group with the pharmacist.Today was Pt.'s discharge date.  In check in Pt. reported feeling anxious and we processed how leaving group can bring up anxiety and fear.  Pt. reported feeling ready to leave group and was teary eyed when hearing everyone's kind words of goodbye.  Nancie Neas, LPC
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
## 2016-09-08 ENCOUNTER — Encounter (HOSPITAL_COMMUNITY): Payer: Self-pay | Admitting: Psychiatry

## 2016-09-08 ENCOUNTER — Ambulatory Visit (INDEPENDENT_AMBULATORY_CARE_PROVIDER_SITE_OTHER): Payer: BLUE CROSS/BLUE SHIELD | Admitting: Psychiatry

## 2016-09-08 DIAGNOSIS — Z818 Family history of other mental and behavioral disorders: Secondary | ICD-10-CM

## 2016-09-08 DIAGNOSIS — F321 Major depressive disorder, single episode, moderate: Secondary | ICD-10-CM | POA: Diagnosis not present

## 2016-09-08 DIAGNOSIS — F411 Generalized anxiety disorder: Secondary | ICD-10-CM | POA: Diagnosis not present

## 2016-09-08 DIAGNOSIS — Z79899 Other long term (current) drug therapy: Secondary | ICD-10-CM | POA: Diagnosis not present

## 2016-09-08 MED ORDER — ARIPIPRAZOLE 2 MG PO TABS: 2.0000 mg | ORAL_TABLET | Freq: Every day | ORAL | 0 refills | Status: DC

## 2016-09-08 MED ORDER — DESVENLAFAXINE SUCCINATE ER 50 MG PO TB24: 50.0000 mg | ORAL_TABLET | Freq: Every day | ORAL | 0 refills | Status: DC
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
## 2016-09-08 NOTE — Progress Notes (Signed)
[facility] IOP DISCHARGE NOTE  Patient:  Kristine Lee DOB:  [birth year]  Date of Admission: 08/18/2016  Date of Discharge: 09/05/2016  Reason for Admission:depression  IOP Course:attended and participated.  She reported feeling much better with less depression and no suicidal thinking with optimism for her future  Mental Status at Discharge:no suicidal thoughts  Diagnosis: major depression, recurrent moderate  Level of Care:  IOP  Discharge destination: has appointments with her psychiatrist and therapist    Comments:  none  The patient received suicide prevention pamphlet:  Yes   Donnelly Angelica, MD

## 2016-09-09 ENCOUNTER — Other Ambulatory Visit (HOSPITAL_COMMUNITY): Payer: BLUE CROSS/BLUE SHIELD

## 2016-09-12 ENCOUNTER — Other Ambulatory Visit (HOSPITAL_COMMUNITY): Payer: BLUE CROSS/BLUE SHIELD

## 2016-09-13 ENCOUNTER — Other Ambulatory Visit (HOSPITAL_COMMUNITY): Payer: BLUE CROSS/BLUE SHIELD

## 2016-09-14 ENCOUNTER — Other Ambulatory Visit (HOSPITAL_COMMUNITY): Payer: BLUE CROSS/BLUE SHIELD

## 2016-09-15 ENCOUNTER — Other Ambulatory Visit (HOSPITAL_COMMUNITY): Payer: BLUE CROSS/BLUE SHIELD

## 2016-09-16 ENCOUNTER — Other Ambulatory Visit (HOSPITAL_COMMUNITY): Payer: BLUE CROSS/BLUE SHIELD

## 2016-09-19 ENCOUNTER — Other Ambulatory Visit (HOSPITAL_COMMUNITY): Payer: BLUE CROSS/BLUE SHIELD

## 2016-09-19 ENCOUNTER — Ambulatory Visit (INDEPENDENT_AMBULATORY_CARE_PROVIDER_SITE_OTHER): Payer: BLUE CROSS/BLUE SHIELD | Admitting: Licensed Clinical Social Worker

## 2016-09-19 ENCOUNTER — Encounter (HOSPITAL_COMMUNITY): Payer: Self-pay | Admitting: Licensed Clinical Social Worker

## 2016-09-19 DIAGNOSIS — F321 Major depressive disorder, single episode, moderate: Secondary | ICD-10-CM | POA: Diagnosis not present

## 2016-09-19 DIAGNOSIS — F411 Generalized anxiety disorder: Secondary | ICD-10-CM | POA: Diagnosis not present

## 2016-09-19 NOTE — Progress Notes (Signed)
   THERAPIST PROGRESS NOTE  Session Time: 1:10-2pm  Participation Level: Active  Behavioral Response: CasualAlertAnxious  Type of Therapy: Individual Therapy  Treatment Goals addressed: Coping  Interventions: Supportive  Summary: Kristine Lee is a 46 y.o. female. Pt is a referral from IOP. Today is the first day of therapy so spent a considerable amount of time in development of a healthy therapeutic relationship and background information. Pt has a lot of coping skills she has learned from IOP but is using none of them. Pt wants a refresher course to deal with her anxious thoughts and depressive feelings. Suggested to pt to also attend the OP evening group 1 night per week. Pt was in agreement with the suggestions. Gave pt homework assignments: journal, guided imagery, breathing exercises. Pt was in agreement of the homework assignments.   Suicidal/Homicidal: Nowithout intent/plan  Therapist Response: Assess pt'Lee current level of functioning for baseline. Assisted pt in realizing what coping skills have worked in the past and practiced using them.  Plan: Return again in 2 weeks.for individual therapy and tomorrow night for group therapy.  Diagnosis: Axis I: Anxiety state, Moderate single current episode of MDD        [name],Kristine Lee, Kristine Lee 09/19/2016

## 2016-09-20 ENCOUNTER — Other Ambulatory Visit (HOSPITAL_COMMUNITY): Payer: Self-pay | Admitting: Psychiatry

## 2016-09-20 ENCOUNTER — Other Ambulatory Visit (HOSPITAL_COMMUNITY): Payer: BLUE CROSS/BLUE SHIELD

## 2016-09-20 ENCOUNTER — Ambulatory Visit (INDEPENDENT_AMBULATORY_CARE_PROVIDER_SITE_OTHER): Payer: BLUE CROSS/BLUE SHIELD | Admitting: Licensed Clinical Social Worker

## 2016-09-20 ENCOUNTER — Telehealth (HOSPITAL_COMMUNITY): Payer: Self-pay

## 2016-09-20 DIAGNOSIS — F411 Generalized anxiety disorder: Secondary | ICD-10-CM | POA: Diagnosis not present

## 2016-09-20 DIAGNOSIS — F321 Major depressive disorder, single episode, moderate: Secondary | ICD-10-CM

## 2016-09-20 MED ORDER — BUSPIRONE HCL 5 MG PO TABS: 5.0000 mg | ORAL_TABLET | Freq: Every day | ORAL | 0 refills | Status: DC

## 2016-09-20 NOTE — Telephone Encounter (Signed)
Per Dr. Adele Schilder a 30 day order of Buspar 5 mg 1 po qd was sent to the pharmacy. I attempted to call patient  - no voicemail set up.

## 2016-09-20 NOTE — Telephone Encounter (Signed)
Patient was in to see therapist Kristine Lee. She states that the Abilify added to her Pristiq was hleping, but her anxiety is coming back. She does not want to go back to the Xanax and the Hydroxyxine makes her too drowsy. She would like to know what she should do. Please review and advise, thank you

## 2016-09-21 ENCOUNTER — Other Ambulatory Visit (HOSPITAL_COMMUNITY): Payer: BLUE CROSS/BLUE SHIELD

## 2016-09-21 ENCOUNTER — Ambulatory Visit (HOSPITAL_COMMUNITY): Payer: Self-pay | Admitting: Licensed Clinical Social Worker

## 2016-09-21 ENCOUNTER — Encounter (HOSPITAL_COMMUNITY): Payer: Self-pay | Admitting: Licensed Clinical Social Worker

## 2016-09-21 NOTE — Progress Notes (Signed)
Daily Group Progress Note Program:  OP Evening Group   Group Time: 5:30-6:30  Participation Level: Active  Behavioral Response: Appropriate  Type of Therapy:  Psychoeducation/Therapy  Summary of Progress:  Pt participated in check-in, describing psychiatric symptoms and current life events. Pt participated in a discussion on co-dependency, traits and recovery.  Pt was encouraged to use her tools of recovery to develop and maintain healthy relationships.  Kristine Lee, LCAS 

## 2016-09-22 ENCOUNTER — Encounter: Payer: Self-pay | Admitting: Podiatry

## 2016-09-22 ENCOUNTER — Ambulatory Visit (INDEPENDENT_AMBULATORY_CARE_PROVIDER_SITE_OTHER): Payer: BLUE CROSS/BLUE SHIELD | Admitting: Podiatry

## 2016-09-22 ENCOUNTER — Other Ambulatory Visit (HOSPITAL_COMMUNITY): Payer: BLUE CROSS/BLUE SHIELD

## 2016-09-22 DIAGNOSIS — M722 Plantar fascial fibromatosis: Secondary | ICD-10-CM | POA: Diagnosis not present

## 2016-09-23 ENCOUNTER — Other Ambulatory Visit (HOSPITAL_COMMUNITY): Payer: BLUE CROSS/BLUE SHIELD

## 2016-09-25 NOTE — Progress Notes (Signed)
She presents today for follow-up of her plantar fasciitis states that her heel is hurting. She states the medicine really didn't seem to help very much.  Objective: Vital signs are stable she is alert and oriented 3 pulses are palpable still has pain on palpation medial trochanter to the right heel.  Assessment: Plantar fasciitis.  Plan: I injected the right heel today encouraged CONSERVATIVE therapies. Follow up with me in 1 month

## 2016-09-26 ENCOUNTER — Other Ambulatory Visit (HOSPITAL_COMMUNITY): Payer: BLUE CROSS/BLUE SHIELD

## 2016-09-27 ENCOUNTER — Other Ambulatory Visit (HOSPITAL_COMMUNITY): Payer: BLUE CROSS/BLUE SHIELD

## 2016-09-27 ENCOUNTER — Ambulatory Visit (INDEPENDENT_AMBULATORY_CARE_PROVIDER_SITE_OTHER): Payer: BLUE CROSS/BLUE SHIELD | Admitting: Licensed Clinical Social Worker

## 2016-09-27 DIAGNOSIS — F411 Generalized anxiety disorder: Secondary | ICD-10-CM

## 2016-09-27 DIAGNOSIS — F321 Major depressive disorder, single episode, moderate: Secondary | ICD-10-CM | POA: Diagnosis not present

## 2016-09-28 ENCOUNTER — Other Ambulatory Visit (HOSPITAL_COMMUNITY): Payer: BLUE CROSS/BLUE SHIELD

## 2016-09-29 ENCOUNTER — Telehealth (HOSPITAL_COMMUNITY): Payer: Self-pay

## 2016-09-29 ENCOUNTER — Other Ambulatory Visit (HOSPITAL_COMMUNITY): Payer: BLUE CROSS/BLUE SHIELD

## 2016-09-29 ENCOUNTER — Ambulatory Visit: Payer: BLUE CROSS/BLUE SHIELD | Admitting: Podiatry

## 2016-09-29 ENCOUNTER — Encounter (HOSPITAL_COMMUNITY): Payer: Self-pay | Admitting: Licensed Clinical Social Worker

## 2016-09-29 NOTE — Telephone Encounter (Signed)
Patient told therapist Eustaquio Maize in group last night that the Buspar given to her on 5/9 - 5 mg 1 po qd is making her too sleepy. Please review and advise, thank you

## 2016-09-29 NOTE — Telephone Encounter (Signed)
This is a very low dose and she need to try little longer.  Usually it tolerates well after 2 weeks.

## 2016-09-29 NOTE — Progress Notes (Signed)
Daily Group Progress Note Program:  Outpatient Group Time: 5:30-6:30pm  Participation Level: Active  Behavioral Response: Appropriate  Type of Therapy:  Psychoeducation/Therapy  Summary of Progress: Pt participated in a discussion on the 3 emotional states of mind: reasonable mind, emotional mind and wise mind. Pt identified which mind she is most familiar with and has a hard time understanding the reasonable mind. Pt was encouraged to continue to work on her mindfulness activities that will influence behavior and mental state to achieve the wise mind.  Reeta Kuk S. Safiya Girdler, LCAS 

## 2016-09-30 ENCOUNTER — Other Ambulatory Visit (HOSPITAL_COMMUNITY): Payer: BLUE CROSS/BLUE SHIELD

## 2016-09-30 NOTE — Telephone Encounter (Signed)
I tried to call the patient, the woman that answered the number we have in the chart said that the patient does not live there and could not provide an alternate number

## 2016-10-03 ENCOUNTER — Other Ambulatory Visit (HOSPITAL_COMMUNITY): Payer: BLUE CROSS/BLUE SHIELD

## 2016-10-04 ENCOUNTER — Ambulatory Visit (INDEPENDENT_AMBULATORY_CARE_PROVIDER_SITE_OTHER): Payer: BLUE CROSS/BLUE SHIELD | Admitting: Licensed Clinical Social Worker

## 2016-10-04 ENCOUNTER — Other Ambulatory Visit (HOSPITAL_COMMUNITY): Payer: BLUE CROSS/BLUE SHIELD

## 2016-10-04 DIAGNOSIS — F321 Major depressive disorder, single episode, moderate: Secondary | ICD-10-CM

## 2016-10-05 ENCOUNTER — Encounter (HOSPITAL_COMMUNITY): Payer: Self-pay | Admitting: Licensed Clinical Social Worker

## 2016-10-05 ENCOUNTER — Other Ambulatory Visit (HOSPITAL_COMMUNITY): Payer: BLUE CROSS/BLUE SHIELD

## 2016-10-05 NOTE — Progress Notes (Signed)
Daily Group Progress Note Program:  Outpatient Group Time: 5:30-6:30pm  Participation Level: Active  Behavioral Response: Appropriate  Type of Therapy:  Psychoeducation/Therapy  Summary of Progress: Pt participated in a discussion on acceptance. Pt talked about the stigma of mental illness, how somehow she could control it "if she only tried," or "it's just a phase you're going through." Pt was encouraged to encourage equality when speaking about mental illness and physical illness when talking about her disease.  Naquisha Whitehair S. Norleen Xie, LCAS 

## 2016-10-06 ENCOUNTER — Other Ambulatory Visit (HOSPITAL_COMMUNITY): Payer: BLUE CROSS/BLUE SHIELD

## 2016-10-07 ENCOUNTER — Other Ambulatory Visit (HOSPITAL_COMMUNITY): Payer: BLUE CROSS/BLUE SHIELD

## 2016-10-11 ENCOUNTER — Ambulatory Visit (INDEPENDENT_AMBULATORY_CARE_PROVIDER_SITE_OTHER): Payer: BLUE CROSS/BLUE SHIELD | Admitting: Licensed Clinical Social Worker

## 2016-10-11 ENCOUNTER — Encounter (HOSPITAL_COMMUNITY): Payer: Self-pay | Admitting: Licensed Clinical Social Worker

## 2016-10-11 ENCOUNTER — Other Ambulatory Visit (HOSPITAL_COMMUNITY): Payer: BLUE CROSS/BLUE SHIELD

## 2016-10-11 DIAGNOSIS — F321 Major depressive disorder, single episode, moderate: Secondary | ICD-10-CM

## 2016-10-11 DIAGNOSIS — F411 Generalized anxiety disorder: Secondary | ICD-10-CM
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
## 2016-10-12 ENCOUNTER — Other Ambulatory Visit (HOSPITAL_COMMUNITY): Payer: BLUE CROSS/BLUE SHIELD

## 2016-10-12 ENCOUNTER — Encounter (HOSPITAL_COMMUNITY): Payer: Self-pay | Admitting: Licensed Clinical Social Worker

## 2016-10-12 NOTE — Progress Notes (Signed)
Daily Group Progress Note     Program: OP   Group Time: 5:30-6:30  Participation Level: Active  Behavioral Response: Appropriate  Type of Therapy:  Psychoeducation/Therapy  Summary of Progress: Pt participated in a discussion on shame and self-compassion. Pt was able to identify a shaming experience during childhood or adulthood and share the powerful emotion of shame. Pt was encouraged to use self-compassion to talk to herself with the same kindness, caring and compassion shown to others.   Lisbeth S. [name], LCAS  

## 2016-10-18 ENCOUNTER — Ambulatory Visit (INDEPENDENT_AMBULATORY_CARE_PROVIDER_SITE_OTHER): Payer: BLUE CROSS/BLUE SHIELD | Admitting: Licensed Clinical Social Worker

## 2016-10-18 DIAGNOSIS — F411 Generalized anxiety disorder: Secondary | ICD-10-CM

## 2016-10-18 DIAGNOSIS — F321 Major depressive disorder, single episode, moderate: Secondary | ICD-10-CM | POA: Diagnosis not present

## 2016-10-19 ENCOUNTER — Encounter (HOSPITAL_COMMUNITY): Payer: Self-pay | Admitting: Licensed Clinical Social Worker

## 2016-10-19 ENCOUNTER — Other Ambulatory Visit (HOSPITAL_COMMUNITY): Payer: Self-pay | Admitting: Psychiatry

## 2016-10-19 NOTE — Progress Notes (Signed)
Daily Group Progress Note     Program: OP   Group Time: 5:30-6:30  Participation Level: Active  Behavioral Response: Appropriate  Type of Therapy:  Psychoeducation/Therapy  Summary of Progress: Pt participated in a discussion on "I am not my mental illness." Pt shared that she does not want to be labeled as her mental illness. She does not want to be ashamed of her mental illness; it is a part of her, not who she is. Pt was encouraged to not let the stigma of mental illness define who she is.  Makella Buckingham S. Orpah Hausner, LCAS 

## 2016-10-20 ENCOUNTER — Encounter: Payer: Self-pay | Admitting: Podiatry

## 2016-10-20 ENCOUNTER — Ambulatory Visit (INDEPENDENT_AMBULATORY_CARE_PROVIDER_SITE_OTHER): Payer: BLUE CROSS/BLUE SHIELD | Admitting: Podiatry

## 2016-10-20 DIAGNOSIS — M2580 Other specified joint disorders, unspecified joint: Secondary | ICD-10-CM | POA: Diagnosis not present

## 2016-10-20 DIAGNOSIS — M722 Plantar fascial fibromatosis: Secondary | ICD-10-CM

## 2016-10-20 MED ORDER — CELECOXIB 200 MG PO CAPS: 200.0000 mg | ORAL_CAPSULE | Freq: Two times a day (BID) | ORAL | 3 refills | Status: DC

## 2016-10-20 NOTE — Progress Notes (Addendum)
Kristine Lee presents today with a 80% improvement in her plantar fasciitis of her right foot. She states that she was not able to take the meloxicam any longer.  Objective: Vital signs are stable alert and oriented 3. Pulses are palpable. His pain on palpation may continue to roll the right heel. The much decreased from previous evaluation. She also has pain on palpation to the tibial sesamoid of the right first metatarsophalangeal joint.  Assessment: Plantar fasciitis right. 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  Plan: Start her on Celebrex 200 mg twice daily. Recommend she continue all conservative therapies follow up with her in 1 month necessary.:  We will set her up for a new pair of orthotics with Rick. We probably need to cut out beneath the first metatarsophalangeal joint to help offload the sesamoid.

## 2016-10-24 ENCOUNTER — Other Ambulatory Visit (HOSPITAL_COMMUNITY): Payer: Self-pay | Admitting: Psychiatry

## 2016-10-24 ENCOUNTER — Ambulatory Visit (INDEPENDENT_AMBULATORY_CARE_PROVIDER_SITE_OTHER): Payer: BLUE CROSS/BLUE SHIELD | Admitting: Podiatry

## 2016-10-24 DIAGNOSIS — M722 Plantar fascial fibromatosis: Secondary | ICD-10-CM

## 2016-10-24 DIAGNOSIS — M2580 Other specified joint disorders, unspecified joint: Secondary | ICD-10-CM

## 2016-10-24 MED ORDER — BUSPIRONE HCL 5 MG PO TABS: 5.0000 mg | ORAL_TABLET | Freq: Every day | ORAL | 1 refills | Status: DC

## 2016-10-24 NOTE — Progress Notes (Signed)
Patient came in today for F/O upon recommendation Dr. Milinda Pointer....presents with Plantar F, sesamoiditis, primarily Right.Marland KitchenMarland KitchenMarland KitchenGoal is to take pressure off PA at insertion calcaneous, off load sesamoids...plan on subortholen F/O w/ hallux limitis (valgus) ff wedge and 1st met head cut out to drop first ray.

## 2016-10-25 ENCOUNTER — Ambulatory Visit (INDEPENDENT_AMBULATORY_CARE_PROVIDER_SITE_OTHER): Payer: BLUE CROSS/BLUE SHIELD | Admitting: Licensed Clinical Social Worker

## 2016-10-25 DIAGNOSIS — F321 Major depressive disorder, single episode, moderate: Secondary | ICD-10-CM | POA: Diagnosis not present

## 2016-10-25 DIAGNOSIS — F411 Generalized anxiety disorder: Secondary | ICD-10-CM | POA: Diagnosis not present

## 2016-10-26 ENCOUNTER — Encounter (HOSPITAL_COMMUNITY): Payer: Self-pay | Admitting: Licensed Clinical Social Worker

## 2016-10-26 NOTE — Progress Notes (Signed)
Daily Group Progress Note     Program: OP   Group Time: 5:30-6:30  Participation Level: Active  Behavioral Response: Appropriate  Type of Therapy:  Psychoeducation/Therapy  Summary of Progress: Pt participated in a discussion on grounding techniques, sensory awareness and cognitive awareness. Grounding exercises helped the patient to reorient to the here-and-now and in reality. Pt was encouraged to use the grounding exercises to regain mental focus from an anxious state.  Faiz Weber S. Selestino Nila, LCAS   

## 2016-10-27 ENCOUNTER — Encounter (HOSPITAL_COMMUNITY): Payer: Self-pay | Admitting: Licensed Clinical Social Worker

## 2016-10-27 ENCOUNTER — Ambulatory Visit (INDEPENDENT_AMBULATORY_CARE_PROVIDER_SITE_OTHER): Payer: BLUE CROSS/BLUE SHIELD | Admitting: Licensed Clinical Social Worker

## 2016-10-27 DIAGNOSIS — F411 Generalized anxiety disorder: Secondary | ICD-10-CM

## 2016-10-27 DIAGNOSIS — F321 Major depressive disorder, single episode, moderate: Secondary | ICD-10-CM | POA: Diagnosis not present
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
## 2016-11-01 ENCOUNTER — Ambulatory Visit (INDEPENDENT_AMBULATORY_CARE_PROVIDER_SITE_OTHER): Payer: BLUE CROSS/BLUE SHIELD | Admitting: Licensed Clinical Social Worker

## 2016-11-01 DIAGNOSIS — F411 Generalized anxiety disorder: Secondary | ICD-10-CM

## 2016-11-01 DIAGNOSIS — F321 Major depressive disorder, single episode, moderate: Secondary | ICD-10-CM | POA: Diagnosis not present

## 2016-11-02 ENCOUNTER — Encounter (HOSPITAL_COMMUNITY): Payer: Self-pay | Admitting: Licensed Clinical Social Worker

## 2016-11-02 NOTE — Progress Notes (Signed)
Daily Group Progress Note     Program: OP   Group Time: 5:30-6:30  Participation Level: Active  Behavioral Response: Appropriate  Type of Therapy:  Psychoeducation/Therapy  Summary of Progress: Pt participated in a discussion on hope. Pt experiences anxiety in her personal relationship which has led to feelings of hopelessness. Pt was encouraged to continue to aim to have a satisfying life despite limitations posed by her personal relationship which causes her anxiety. Pt was given a grounding exercise "mindfulness of the breath" handout.  Jenkins Rouge, LCAS

## 2016-11-08 ENCOUNTER — Ambulatory Visit (INDEPENDENT_AMBULATORY_CARE_PROVIDER_SITE_OTHER): Payer: BLUE CROSS/BLUE SHIELD | Admitting: Licensed Clinical Social Worker

## 2016-11-08 DIAGNOSIS — F321 Major depressive disorder, single episode, moderate: Secondary | ICD-10-CM | POA: Diagnosis not present

## 2016-11-14 ENCOUNTER — Encounter (HOSPITAL_COMMUNITY): Payer: Self-pay | Admitting: Licensed Clinical Social Worker

## 2016-11-14 NOTE — Progress Notes (Signed)
Daily Group Progress Note     Program: OP   Group Time: 5:30-6:30  Participation Level: Active  Behavioral Response: Appropriate  Type of Therapy:  Psychoeducation/Therapy  Summary of Progress: Pt participated in a discussion on emotional first aid. Pt watched Ted Talk Emotional First Aid with Guy Winch, with a handout "7 Ways to Practice Emotional First Aid." Pt was encouraged to understand her emotional resilience and take note of her psychological health on a regular basis.  Lisbeth S. [name], LCAS  

## 2016-11-15 ENCOUNTER — Ambulatory Visit (HOSPITAL_COMMUNITY): Payer: Self-pay | Admitting: Licensed Clinical Social Worker

## 2016-11-22 ENCOUNTER — Ambulatory Visit (INDEPENDENT_AMBULATORY_CARE_PROVIDER_SITE_OTHER): Payer: BLUE CROSS/BLUE SHIELD | Admitting: Licensed Clinical Social Worker

## 2016-11-22 DIAGNOSIS — F321 Major depressive disorder, single episode, moderate: Secondary | ICD-10-CM | POA: Diagnosis not present

## 2016-11-22 DIAGNOSIS — F411 Generalized anxiety disorder: Secondary | ICD-10-CM | POA: Diagnosis not present

## 2016-11-23 ENCOUNTER — Encounter (HOSPITAL_COMMUNITY): Payer: Self-pay | Admitting: Licensed Clinical Social Worker

## 2016-11-23 NOTE — Progress Notes (Signed)
Daily Group Progress Note     Program: OP   Group Time: 5:30-6:30  Participation Level: Active  Behavioral Response: Appropriate  Type of Therapy:  Psychoeducation/Therapy  Summary of Progress:  Pt participated in a discussion on Happiness. Pt watched Ted Talk Shawn Achor, "Happiness." Pt was given an action plan guide, "The Happiness Advantage"  where she reviewed the guide. Pt was given a homework assignment to review the guide and begin to work on "The Happiness Advantage." Pt was encouraged to begin her journey to Happiness.  Jenkins Rouge, LCAS

## 2016-11-28 ENCOUNTER — Ambulatory Visit: Payer: BLUE CROSS/BLUE SHIELD | Admitting: *Deleted

## 2016-11-28 DIAGNOSIS — M722 Plantar fascial fibromatosis: Secondary | ICD-10-CM

## 2016-11-28 NOTE — Progress Notes (Signed)
Patient ID: Kristine Lee, female   DOB: 09-Jul-1970, 46 y.o.   MRN: [id number]   Patient presents for orthotic pick up.  Verbal and written break in and wear instructions given.  Patient will follow up in 4 weeks if symptoms worsen or fail to improve.

## 2016-11-28 NOTE — Patient Instructions (Signed)

## 2016-11-29 ENCOUNTER — Ambulatory Visit (INDEPENDENT_AMBULATORY_CARE_PROVIDER_SITE_OTHER): Payer: BLUE CROSS/BLUE SHIELD | Admitting: Licensed Clinical Social Worker

## 2016-11-29 ENCOUNTER — Ambulatory Visit (HOSPITAL_COMMUNITY): Payer: Self-pay | Admitting: Licensed Clinical Social Worker

## 2016-11-29 DIAGNOSIS — F321 Major depressive disorder, single episode, moderate: Secondary | ICD-10-CM | POA: Diagnosis not present

## 2016-11-29 DIAGNOSIS — F411 Generalized anxiety disorder: Secondary | ICD-10-CM

## 2016-11-30 ENCOUNTER — Encounter (HOSPITAL_COMMUNITY): Payer: Self-pay | Admitting: Licensed Clinical Social Worker

## 2016-11-30 NOTE — Progress Notes (Signed)
   THERAPIST PROGRESS NOTE  Session Time:  3:30-4pm  Participation Level: Active  Behavioral Response: CasualAlertAnxious  Type of Therapy: Individual Therapy  Treatment Goals addressed: Coping  Interventions: Supportive  Summary: Kristine Lee is a 46 y.o. female who presents for her individual counseling session. Pt discussed her psychiatric symptoms and current life events. Pt reports she is very stressed still in her home life. She and her partner are still not getting along. They have an appt with a marriage counselor. Taught pt problem solving skills to use during arguments. Processed with pt reasons why the same arguments continue. Pt is back on 1st shift with her job which should assist in her mood modulation. Pt shared how she struggles with her lack of self worth. Gave pt a self esteem booklet and handouts. Pt was glad to get the workbook as she likes homework. Pt reports she will not be in OP group tonight because she has another obligation.    Suicidal/Homicidal: Nowithout intent/plan  Therapist Response: Assess pt's current functioning and reviewed progress. Assisted pt process relationship issues, self esteem, problem solving skills. Assisted pt processing for the management of stressors.  Plan: Return again in 2 weeks.for individual therapy and weekly group therapy. Bring back self esteem workbook.  Diagnosis: Axis I: Anxiety state, Moderate single current episode of MDD        Tauren Delbuono S, LCAS 11/29/16

## 2016-12-06 ENCOUNTER — Ambulatory Visit (INDEPENDENT_AMBULATORY_CARE_PROVIDER_SITE_OTHER): Payer: BLUE CROSS/BLUE SHIELD | Admitting: Licensed Clinical Social Worker

## 2016-12-06 DIAGNOSIS — F411 Generalized anxiety disorder: Secondary | ICD-10-CM

## 2016-12-06 DIAGNOSIS — F321 Major depressive disorder, single episode, moderate: Secondary | ICD-10-CM

## 2016-12-07 ENCOUNTER — Encounter (HOSPITAL_COMMUNITY): Payer: Self-pay | Admitting: Licensed Clinical Social Worker

## 2016-12-07 NOTE — Progress Notes (Signed)
Daily Group Progress Note     Program: OP   Group Time: 5:30-6:30  Participation Level: Active  Behavioral Response: Appropriate  Type of Therapy:  Psychoeducation/Therapy  Summary of Progress: Pt participated in a discussion on unhealthy relationships where the group discussed fair fighting, what a healthy relationship entails, and how to move from an unhealthy to a healthy relationship. Pt was encouraged to work on her current relationship and ask for what she wants and needs in a relationship.  Jenkins Rouge, LCAS

## 2016-12-08 ENCOUNTER — Encounter (HOSPITAL_COMMUNITY): Payer: Self-pay | Admitting: Psychiatry

## 2016-12-08 ENCOUNTER — Ambulatory Visit (INDEPENDENT_AMBULATORY_CARE_PROVIDER_SITE_OTHER): Payer: BLUE CROSS/BLUE SHIELD | Admitting: Psychiatry

## 2016-12-08 VITALS — BP 110/70 | HR 73 | Ht 63.5 in | Wt 151.4 lb

## 2016-12-08 DIAGNOSIS — F411 Generalized anxiety disorder: Secondary | ICD-10-CM | POA: Diagnosis not present

## 2016-12-08 DIAGNOSIS — Z818 Family history of other mental and behavioral disorders: Secondary | ICD-10-CM | POA: Diagnosis not present

## 2016-12-08 DIAGNOSIS — F321 Major depressive disorder, single episode, moderate: Secondary | ICD-10-CM

## 2016-12-08 MED ORDER — BUSPIRONE HCL 5 MG PO TABS: 5.0000 mg | ORAL_TABLET | Freq: Two times a day (BID) | ORAL | 0 refills | Status: DC

## 2016-12-08 MED ORDER — DESVENLAFAXINE SUCCINATE ER 50 MG PO TB24: 50.0000 mg | ORAL_TABLET | Freq: Every day | ORAL | 0 refills | Status: DC

## 2016-12-08 MED ORDER — ARIPIPRAZOLE 2 MG PO TABS: 2.0000 mg | ORAL_TABLET | Freq: Every day | ORAL | 0 refills | Status: DC
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
## 2016-12-12 ENCOUNTER — Ambulatory Visit (HOSPITAL_COMMUNITY): Payer: Self-pay | Admitting: Licensed Clinical Social Worker

## 2016-12-13 ENCOUNTER — Ambulatory Visit (INDEPENDENT_AMBULATORY_CARE_PROVIDER_SITE_OTHER): Payer: BLUE CROSS/BLUE SHIELD | Admitting: Licensed Clinical Social Worker

## 2016-12-13 DIAGNOSIS — F321 Major depressive disorder, single episode, moderate: Secondary | ICD-10-CM

## 2016-12-19 NOTE — Progress Notes (Signed)
Daily Group Progress Note     Program: OP   Group Time: 5:30-6:30  Participation Level: Active  Behavioral Response: Appropriate  Type of Therapy:  Psychotherapy; Group therapy  Summary of Progress: Pt presented with euthymic affect and states she had a "decent" week. Pt shared she has some time off next week and has fun things planned. Pt engaged in discussion on acceptance of where we are versus where we thought we would be. Pt provided feedback. Pt denies SI/HI.   Lorin Glass MSW, LCSW, LCAS

## 2016-12-26 ENCOUNTER — Ambulatory Visit (HOSPITAL_COMMUNITY): Payer: Self-pay | Admitting: Licensed Clinical Social Worker

## 2016-12-28 ENCOUNTER — Encounter (HOSPITAL_COMMUNITY): Payer: Self-pay | Admitting: Licensed Clinical Social Worker

## 2016-12-28 ENCOUNTER — Telehealth (HOSPITAL_COMMUNITY): Payer: Self-pay

## 2016-12-28 ENCOUNTER — Ambulatory Visit (INDEPENDENT_AMBULATORY_CARE_PROVIDER_SITE_OTHER): Payer: BLUE CROSS/BLUE SHIELD | Admitting: Licensed Clinical Social Worker

## 2016-12-28 DIAGNOSIS — F321 Major depressive disorder, single episode, moderate: Secondary | ICD-10-CM

## 2016-12-28 NOTE — Telephone Encounter (Signed)
Patient was here to see her therapist Eustaquio Maize, she asked to see me about her medications. Patient reports that ever since the increase of Buspar she feels like her anxiety is worse. Patient says that the initial dose of 5 mg a day was good, but now she feels like she is back to square one. Please review and advise, thank you

## 2016-12-28 NOTE — Progress Notes (Signed)
   THERAPIST PROGRESS NOTE  Session Time:  3:30-4pm  Participation Level: Active  Behavioral Response: CasualAlertAnxious  Type of Therapy: Individual Therapy  Treatment Goals addressed: Coping  Interventions: Supportive  Summary: Kristine Lee is a 46 y.o. female who presents for her individual counseling session. Pt discussed her psychiatric symptoms and current life events. Pt reports she is more anxious since Dr. Adele Schilder upped her Buspar medication.Asked CNA Regan to come and speak with pt about her medication. Pt reports she and her partner are doing much better. She has agreed to go to therapy and then they will also seek marriage counseling. Pt is continuing to work on herself as well. She just finished "Gifts of Imper[employer]." Processed her readings with her. Pt has cut back on journaling. Suggested to continue to journal and to try meditation at bedtime for her ruminations at bedtime. Encouraged pt to continue her own self care, eating healthy, rest, exercise. Pt was open to suggestions.    Suicidal/Homicidal: Nowithout intent/plan  Therapist Response: Assess pt'Lee current functioning and reviewed progress. Assisted pt process relationship issues, self care, meditation, readings, journaling. Assisted pt processing for the management of stressors.  Plan: Return again in 2 weeks.for individual therapy. Bring back self esteem workbook.  Diagnosis: Axis I: Anxiety state, Moderate single current episode of MDD        Kristine Lee, LCAS 12/28/16

## 2016-12-29 ENCOUNTER — Other Ambulatory Visit (HOSPITAL_COMMUNITY): Payer: Self-pay | Admitting: Psychiatry

## 2016-12-29 NOTE — Telephone Encounter (Signed)
She can go down to 5 mg once a day

## 2016-12-29 NOTE — Telephone Encounter (Signed)
Called patient and let her know what Dr. Adele Schilder said, I told her to call me back with any questions or problems

## 2017-01-09 ENCOUNTER — Ambulatory Visit (HOSPITAL_COMMUNITY): Payer: Self-pay | Admitting: Licensed Clinical Social Worker

## 2017-01-11 ENCOUNTER — Ambulatory Visit (INDEPENDENT_AMBULATORY_CARE_PROVIDER_SITE_OTHER): Payer: BLUE CROSS/BLUE SHIELD | Admitting: Licensed Clinical Social Worker

## 2017-01-11 DIAGNOSIS — F321 Major depressive disorder, single episode, moderate: Secondary | ICD-10-CM

## 2017-01-11 DIAGNOSIS — F411 Generalized anxiety disorder: Secondary | ICD-10-CM | POA: Diagnosis not present
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
## 2017-01-12 ENCOUNTER — Encounter (HOSPITAL_COMMUNITY): Payer: Self-pay | Admitting: Licensed Clinical Social Worker

## 2017-01-20 IMAGING — CR DG CHEST 2V
2 series · 2 of 2 positions shown · non-contrast
Comparison: 09/25/2012

CLINICAL DATA: Productive cough, wheezing, URI

EXAM:
CHEST  2 VIEW

[PA]
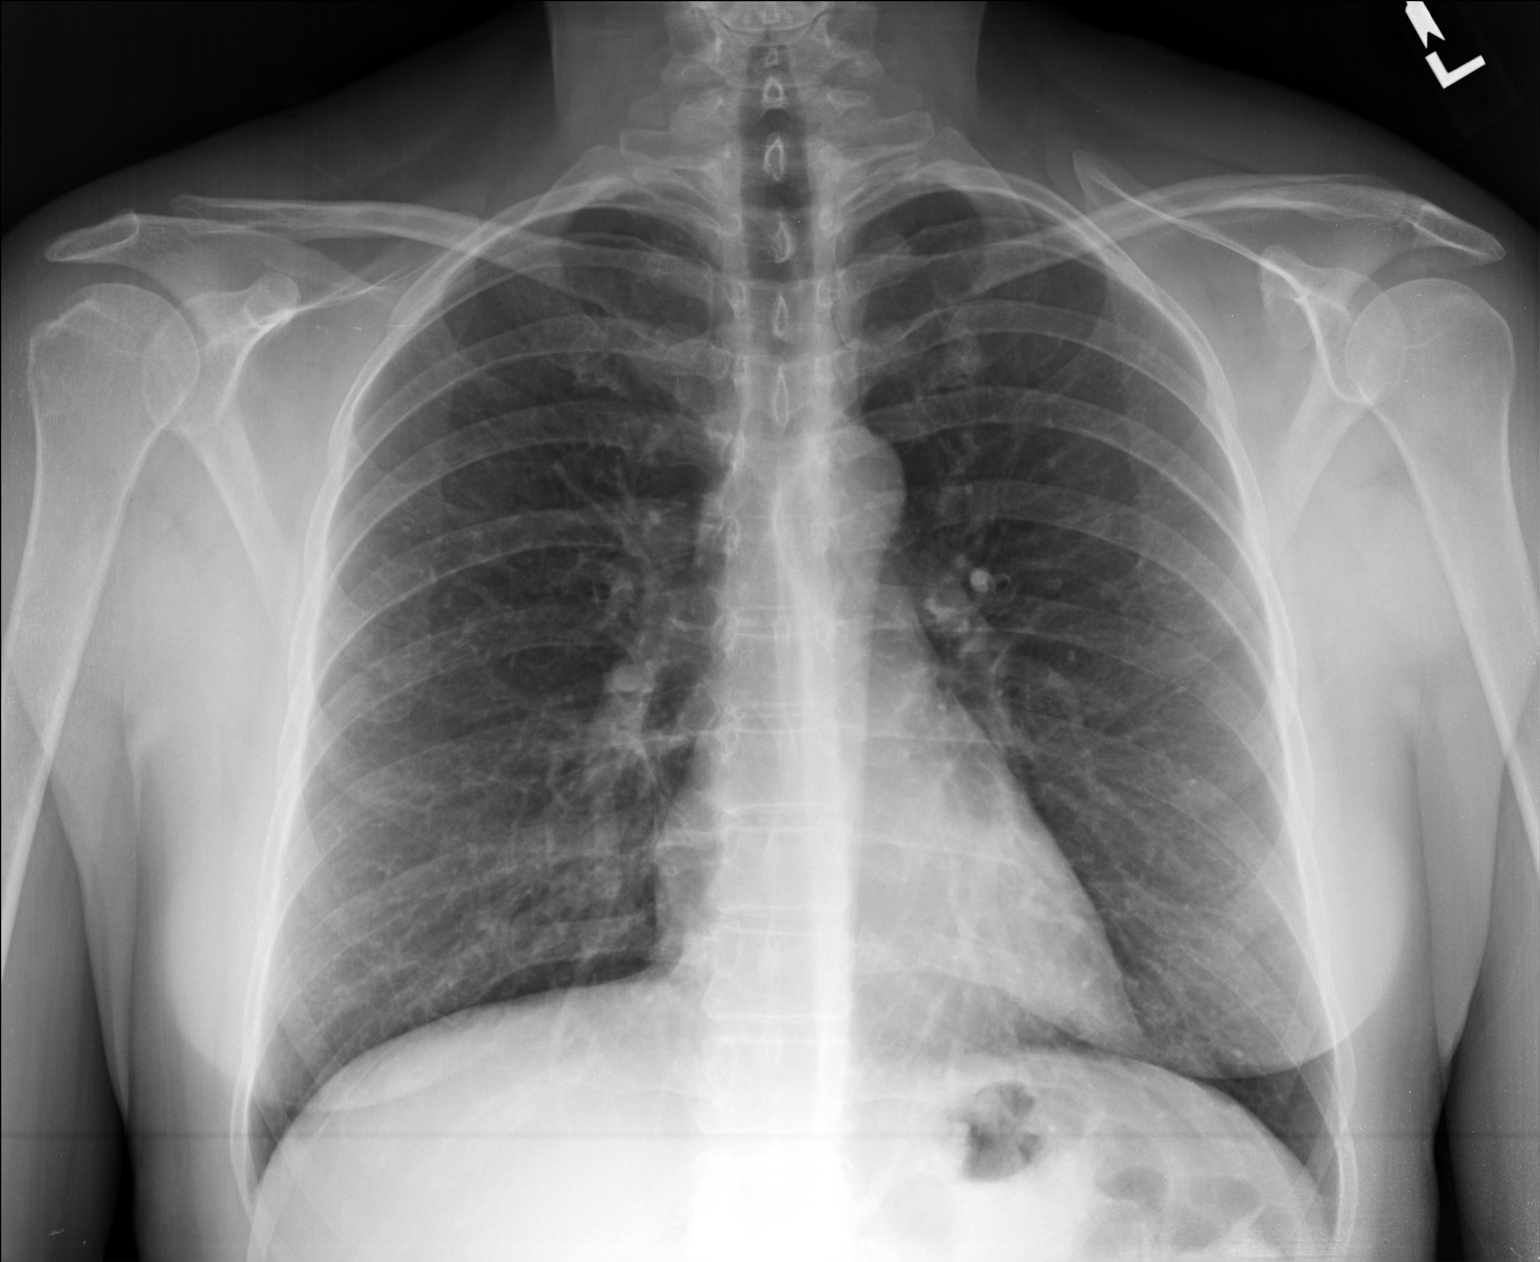

[lateral]
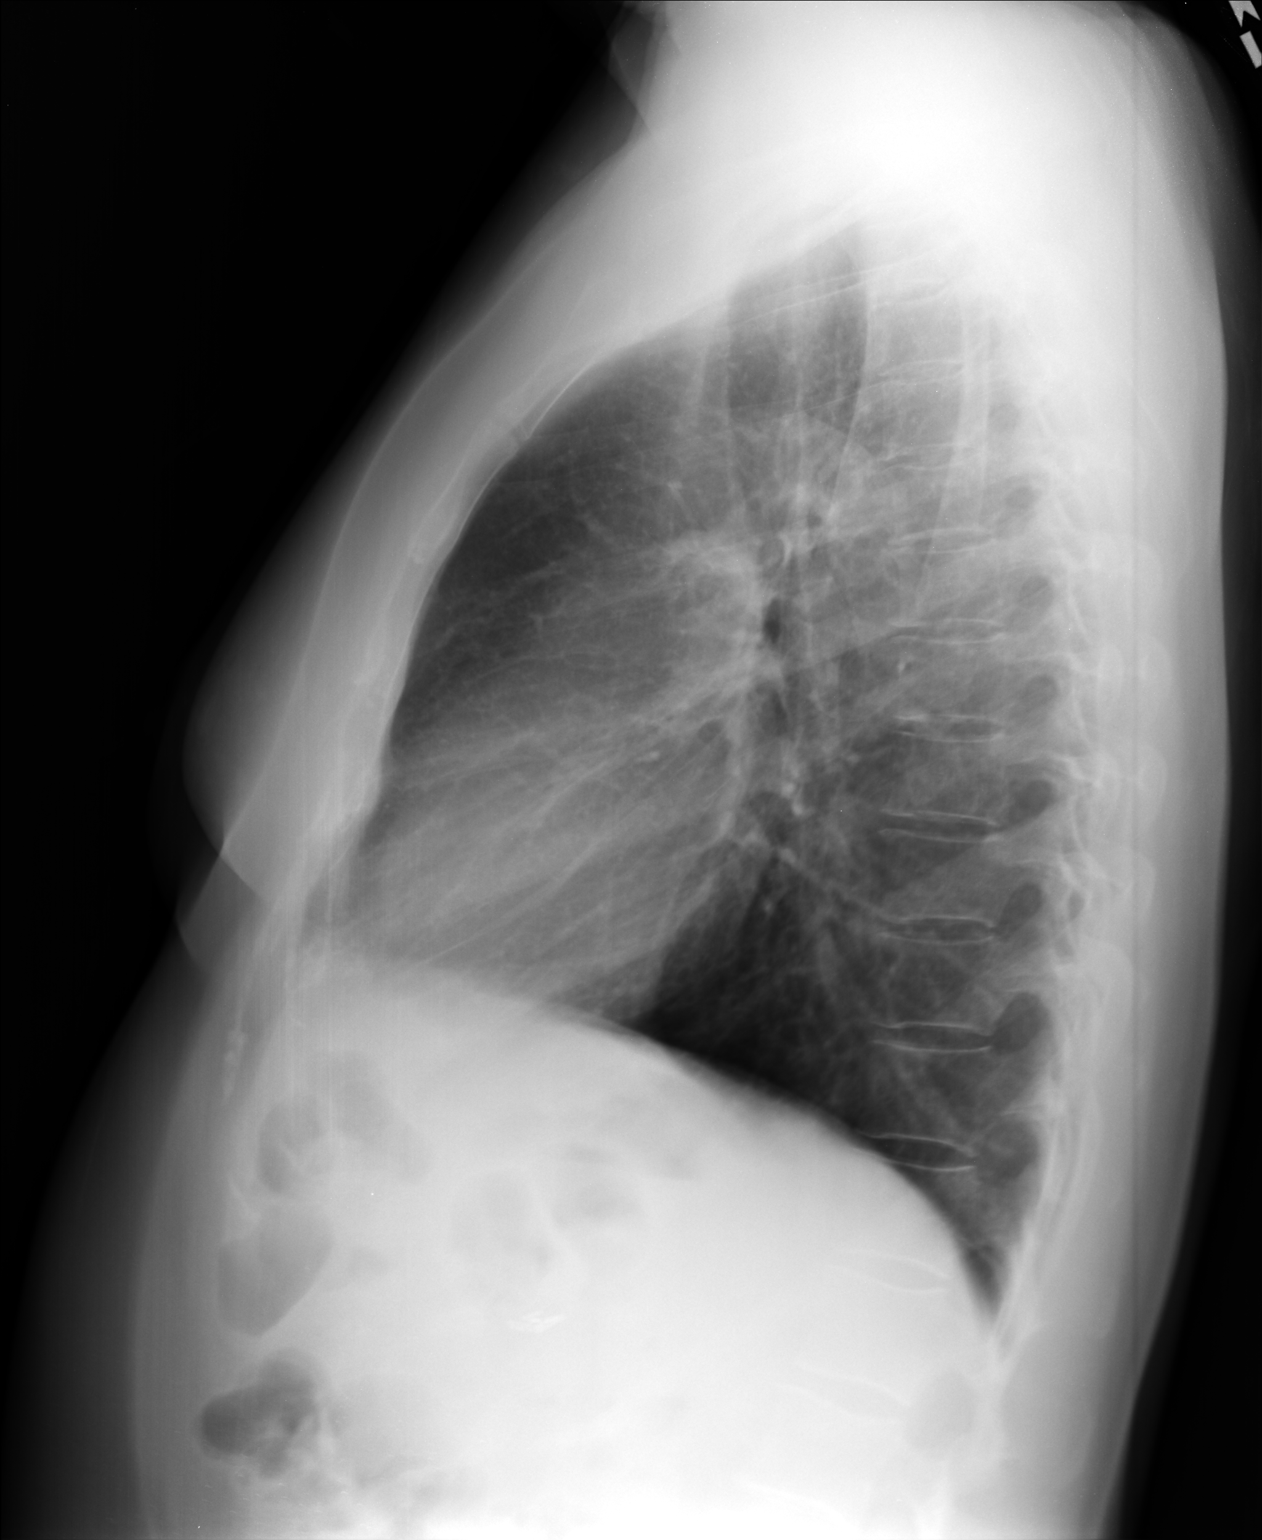

[2 of 2 positions shown; findings below may reference images not displayed]

FINDINGS: Normal heart size, mediastinal contours, and pulmonary vascularity.

Lungs mildly hyperinflated but clear.

No pneumothorax.

Bones unremarkable.
IMPRESSION: Chronic hyperinflation without acute infiltrate.

No active cardiopulmonary disease.

## 2017-01-25 ENCOUNTER — Ambulatory Visit (INDEPENDENT_AMBULATORY_CARE_PROVIDER_SITE_OTHER): Payer: BLUE CROSS/BLUE SHIELD | Admitting: Licensed Clinical Social Worker

## 2017-01-25 ENCOUNTER — Encounter (HOSPITAL_COMMUNITY): Payer: Self-pay | Admitting: Licensed Clinical Social Worker

## 2017-01-25 DIAGNOSIS — F411 Generalized anxiety disorder: Secondary | ICD-10-CM | POA: Diagnosis not present

## 2017-01-25 DIAGNOSIS — F321 Major depressive disorder, single episode, moderate: Secondary | ICD-10-CM | POA: Diagnosis not present

## 2017-01-25 NOTE — Progress Notes (Signed)
   THERAPIST PROGRESS NOTE  Session Time:  4:10-5pm  Participation Level: Active  Behavioral Response: CasualAlertAnxious  Type of Therapy: Individual Therapy  Treatment Goals addressed: Coping  Interventions: Supportive  Summary: ALAIYAH BOLLMAN is a 46 y.o. female who presents for her individual counseling session. Pt discussed her psychiatric symptoms and current life events. Pt feels her medication is working well now. Her moods have stabilized. Her house closed in [place], she bought a new truck and her partner bought her SUV. Pt reports her relationship is doing better, they are communicating more. Again reviewed I-messages with pt. Pt was receptive to the review. Pt brought in her self esteem module. Taught pt how to identify her negative core beliefs. Discussed her core beliefs. Suggested to pt to journal her childhood memories. Gave pt worksheet working with her core beliefs.Pt was open to suggestions.    Suicidal/Homicidal: Nowithout intent/plan  Therapist Response: Assess pt's current functioning and reviewed progress. Assisted pt process relationship issues, journaling, core beliefs, communication skills, . Assisted pt processing for the management of stressors.  Plan: Return again in 2 weeks.for individual therapy. Bring back self esteem workbook.  Diagnosis: Axis I: Anxiety state, Moderate single current episode of MDD        [name],LISBETH S, LCAS 01/25/17

## 2017-02-08 ENCOUNTER — Ambulatory Visit (HOSPITAL_COMMUNITY): Payer: Self-pay | Admitting: Licensed Clinical Social Worker

## 2017-02-20 LAB — LIPID PANEL
Cholesterol: 247 — AB (ref 0–200)
HDL: 59 (ref 35–70)
LDL CALC: 157
Triglycerides: 170 — AB (ref 40–160)

## 2017-02-20 LAB — HEMOGLOBIN A1C: HEMOGLOBIN A1C: 5.8

## 2017-02-20 LAB — HEPATIC FUNCTION PANEL
ALT: 34 (ref 7–35)
Alkaline Phosphatase: 104 (ref 25–125)
BILIRUBIN DIRECT: 0.1
Bilirubin, Total: 0.3

## 2017-02-20 LAB — CBC AND DIFFERENTIAL
HEMATOCRIT: 38 (ref 36–46)
HEMOGLOBIN: 12.6 (ref 12.0–16.0)
Platelets: 255 (ref 150–399)
WBC: 6.1

## 2017-02-20 LAB — BASIC METABOLIC PANEL
Creatinine: 0.9 (ref <=1.1)
GLUCOSE: 103

## 2017-02-20 LAB — TSH: TSH: 3.15 (ref <=5.90)

## 2017-02-28 ENCOUNTER — Encounter: Payer: Self-pay | Admitting: Physician Assistant

## 2017-02-28 ENCOUNTER — Ambulatory Visit (INDEPENDENT_AMBULATORY_CARE_PROVIDER_SITE_OTHER): Payer: BLUE CROSS/BLUE SHIELD | Admitting: Physician Assistant

## 2017-02-28 VITALS — BP 102/70 | HR 77 | Temp 97.8°F | Resp 18 | Ht 64.0 in | Wt 155.0 lb

## 2017-02-28 DIAGNOSIS — K219 Gastro-esophageal reflux disease without esophagitis: Secondary | ICD-10-CM

## 2017-02-28 DIAGNOSIS — M722 Plantar fascial fibromatosis: Secondary | ICD-10-CM

## 2017-02-28 DIAGNOSIS — Z0000 Encounter for general adult medical examination without abnormal findings: Secondary | ICD-10-CM | POA: Diagnosis not present

## 2017-02-28 DIAGNOSIS — N3281 Overactive bladder: Secondary | ICD-10-CM | POA: Diagnosis not present

## 2017-02-28 MED ORDER — MELOXICAM 15 MG PO TABS: 15.0000 mg | ORAL_TABLET | Freq: Every day | ORAL | 3 refills | Status: DC

## 2017-02-28 MED ORDER — OMEPRAZOLE 40 MG PO CPDR: DELAYED_RELEASE_CAPSULE | ORAL | 3 refills | Status: AC

## 2017-02-28 MED ORDER — FESOTERODINE FUMARATE ER 4 MG PO TB24: 4.0000 mg | ORAL_TABLET | Freq: Every day | ORAL | 1 refills | Status: DC

## 2017-02-28 NOTE — Progress Notes (Signed)
Kristine Lee  MRN: [id number] DOB: 10-Dec-1970  PCP: Mancel Bale, PA-C  Subjective:  Pt presents to clinic for a CPE.  Dealing with likely celiac disease - she has had positive testing and has a endoscopy scheduled with her GI specialist.  She psychiatry for mental health medications and she seems to be a little better but still deals with a lot of anxiety and not wanting to be alone.  Last dental exam: every 6 months for cleaning Last vision exam: wears glasses- last checked 2/18 Last pap: has GYN - within the year Last mammo: last year  Vaccinations - UTD  Typical meals for patient: breakfast - cereal with almond milk, fruit snack and granola bar, lunch with quinoa salad and veggies, snack fruit, dinner veggie and meat - she does have bad snacks at night esp when she is alone Typical beverage choices: water at least 64 oz, 1 12oz diet coke Exercises: none currently Sleeps: 8 hrs per night and sleeping well   Patient Active Problem List   Diagnosis Date Noted  . GERD (gastroesophageal reflux disease) 06/09/2016  . Urge incontinence 06/09/2016  . Degeneration of cervical intervertebral disc 04/23/2015  . Medial epicondylitis of right elbow 04/23/2015  . Anxiety and depression 04/18/2013    Review of Systems  Constitutional: Negative.   HENT: Negative.   Eyes: Negative.   Respiratory: Negative.   Cardiovascular: Negative.   Gastrointestinal: Positive for diarrhea (being evaluated for celiac).  Endocrine: Negative.   Genitourinary: Positive for urgency (random - ditropan caused sleepy and angry).  Musculoskeletal: Negative.        Problems with plantar fasciitis - has used mobic in the past which helps from the ortho office  Skin: Negative.   Allergic/Immunologic: Positive for environmental allergies (knonw - uses OTC medications).  Neurological: Negative.   Hematological: Negative.   Psychiatric/Behavioral: The patient is nervous/anxious.      Current  Outpatient Prescriptions on File Prior to Visit  Medication Sig Dispense Refill  . ARIPiprazole (ABILIFY) 2 MG tablet Take 1 tablet (2 mg total) by mouth daily. 90 tablet 0  . busPIRone (BUSPAR) 5 MG tablet Take 1 tablet (5 mg total) by mouth 2 (two) times daily. 180 tablet 0  . cetirizine (ZYRTEC) 10 MG tablet Take 10 mg by mouth daily.    Marland Kitchen desvenlafaxine (PRISTIQ) 50 MG 24 hr tablet Take 1 tablet (50 mg total) by mouth daily. 90 tablet 0  . loratadine-pseudoephedrine (CLARITIN-D 12-HOUR) 5-120 MG tablet Take by mouth.     No current facility-administered medications on file prior to visit.     Allergies  Allergen Reactions  . Biaxin [Clarithromycin] Nausea And Vomiting  . Vancomycin Other (See Comments)    RedMan Syndrome    Social History   Social History  . Marital status: Single    Spouse name: N/A  . Number of children: N/A  . Years of education: N/A   Social History Main Topics  . Smoking status: Never Smoker  . Smokeless tobacco: Never Used  . Alcohol use No  . Drug use: No  . Sexual activity: No   Other Topics Concern  . 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 CHOLECYSTECTOMY, LAPAROSCOPIC  09/14/2014  . EYE SURGERY    . FEET SURGERY    .  FRACTURE SURGERY    . KNEE SURGERY    . rotator cuff Right 08/15/2014  . TONSILLECTOMY      Family History  Problem Relation Age of Onset  . Heart disease Father   . Diabetes Sister   . Depression Sister        current on Prozac  . Anxiety disorder Sister   . Diabetes Sister   . Anxiety disorder Sister   . Depression Mother        currentlty on Prozac  . Diabetes Mother   . Anxiety disorder Mother      Objective:  BP 102/70   Pulse 77   Temp 97.8 F (36.6 C) (Oral)   Resp 18   Ht 5' 4"  (1.626 m)   Wt 155 lb (70.3 kg)   SpO2 98%   BMI 26.61 kg/m   Physical  Exam  Constitutional: She is oriented to person, place, and time and well-developed, well-nourished, and in no distress.  HENT:  Head: Normocephalic and atraumatic.  Right Ear: Hearing and external ear normal.  Left Ear: Hearing and external ear normal.  Eyes: Conjunctivae are normal.  Neck: Normal range of motion.  Cardiovascular: Normal rate, regular rhythm and normal heart sounds.   No murmur heard. Pulmonary/Chest: Effort normal and breath sounds normal. She has no wheezes.  Neurological: She is alert and oriented to person, place, and time. Gait normal.  Skin: Skin is warm and dry.  Psychiatric: Mood, memory, affect and judgment normal.  Vitals reviewed.   Wt Readings from Last 3 Encounters:  02/28/17 155 lb (70.3 kg)  06/09/16 145 lb (65.8 kg)  04/27/16 141 lb (64 kg)     Visual Acuity Screening   Right eye Left eye Both eyes  Without correction:     With correction: 20/20 20/15-1 20/15-1    Assessment and Plan :  Annual physical exam  Gastroesophageal reflux disease without esophagitis - Plan: omeprazole (PRILOSEC) 40 MG capsule  Overactive bladder - Plan: fesoterodine (TOVIAZ) 4 MG TB24 tablet - change medication due to side effects of last one but it did help - she will work on making sure she empties her bladder when she feels the need verses waiting longer like she knows she might have in the past - she does drink a lot of water which is great but she may need to listen to her body cues when she might have to urinate - esp at work.  Plantar fasciitis - Plan: meloxicam (MOBIC) 15 MG tablet - refilled - helps and she uses prn.  Windell Hummingbird PA-C  [facility] 03/03/2017 4:21 PM

## 2017-02-28 NOTE — Patient Instructions (Signed)
     IF you received an x-ray today, you will receive an invoice from [facility]. Please contact [facility] at [phone] with questions or concerns regarding your invoice.   IF you received labwork today, you will receive an invoice from [facility]. Please contact [facility] at [phone] with questions or concerns regarding your invoice.   Our billing staff will not be able to assist you with questions regarding bills from these companies.  You will be contacted with the lab results as soon as they are available. The fastest way to get your results is to activate your My Chart account. Instructions are located on the last page of this paperwork. If you have not heard from us regarding the results in 2 weeks, please contact this office.     

## 2017-03-01 ENCOUNTER — Encounter (HOSPITAL_COMMUNITY): Payer: Self-pay | Admitting: Licensed Clinical Social Worker

## 2017-03-01 ENCOUNTER — Ambulatory Visit (INDEPENDENT_AMBULATORY_CARE_PROVIDER_SITE_OTHER): Payer: BLUE CROSS/BLUE SHIELD | Admitting: Licensed Clinical Social Worker

## 2017-03-01 DIAGNOSIS — K900 Celiac disease: Secondary | ICD-10-CM | POA: Diagnosis not present

## 2017-03-01 DIAGNOSIS — F321 Major depressive disorder, single episode, moderate: Secondary | ICD-10-CM | POA: Diagnosis not present

## 2017-03-01 DIAGNOSIS — L130 Dermatitis herpetiformis: Secondary | ICD-10-CM | POA: Diagnosis not present

## 2017-03-01 DIAGNOSIS — Z87891 Personal history of nicotine dependence: Secondary | ICD-10-CM | POA: Diagnosis not present

## 2017-03-01 DIAGNOSIS — R140 Abdominal distension (gaseous): Secondary | ICD-10-CM | POA: Diagnosis not present

## 2017-03-01 DIAGNOSIS — E7800 Pure hypercholesterolemia, unspecified: Secondary | ICD-10-CM | POA: Diagnosis not present

## 2017-03-01 DIAGNOSIS — K219 Gastro-esophageal reflux disease without esophagitis: Secondary | ICD-10-CM | POA: Diagnosis not present

## 2017-03-01 DIAGNOSIS — M2550 Pain in unspecified joint: Secondary | ICD-10-CM | POA: Insufficient documentation

## 2017-03-01 DIAGNOSIS — R1084 Generalized abdominal pain: Secondary | ICD-10-CM | POA: Diagnosis not present

## 2017-03-01 DIAGNOSIS — Z881 Allergy status to other antibiotic agents status: Secondary | ICD-10-CM | POA: Diagnosis not present

## 2017-03-01 NOTE — Progress Notes (Signed)
   THERAPIST PROGRESS NOTE  Session Time:  4:10-5pm  Participation Level: Active  Behavioral Response: CasualAlertAnxious  Type of Therapy: Individual Therapy  Treatment Goals addressed: Coping  Interventions: Supportive  Summary: Kristine Lee is a 46 y.o. female who presents for her individual counseling session. Pt discussed her psychiatric symptoms and current life events.  Pt feels her anxiety symptoms are getting worse. She saw her PCP who suggested she may want to increase her buspar. Suggested she talk with her psychiatrist next week about her medications. Pt and her partner bought a 30' RV. This has been stressful between she and her partner. Taught pt problem solving skills as their disagreements are unsolved problems and their lack of positive communication skills. Role played positive communication skills and problem solvling skills at length. Gave pt problem solving and communication skills worksheets to take home to her partner to practice. Pt was receptive to suggestions, role playing and worksheets. Pt is using quiet time as her coping tool. Also suggested to pt to write down the reason for arguments and look for patterns.     Suicidal/Homicidal: Nowithout intent/plan  Therapist Response: Assess pt's current functioning and reviewed progress. Assisted pt process relationship issues, journaling, problem solving skills, communication skills, . Assisted pt processing for the management of stressors.  Plan: Return again in 2 weeks.for individual therapy.   Diagnosis: Axis I: Anxiety state, Moderate single current episode of MDD        Karsten Vaughn S, LCAS 03/01/17

## 2017-03-07 ENCOUNTER — Encounter (HOSPITAL_COMMUNITY): Payer: Self-pay | Admitting: Psychiatry

## 2017-03-07 ENCOUNTER — Ambulatory Visit (INDEPENDENT_AMBULATORY_CARE_PROVIDER_SITE_OTHER): Payer: BLUE CROSS/BLUE SHIELD | Admitting: Psychiatry

## 2017-03-07 DIAGNOSIS — Z01419 Encounter for gynecological examination (general) (routine) without abnormal findings: Secondary | ICD-10-CM | POA: Diagnosis not present

## 2017-03-07 DIAGNOSIS — Z6826 Body mass index (BMI) 26.0-26.9, adult: Secondary | ICD-10-CM | POA: Diagnosis not present

## 2017-03-07 DIAGNOSIS — F321 Major depressive disorder, single episode, moderate: Secondary | ICD-10-CM | POA: Diagnosis not present

## 2017-03-07 DIAGNOSIS — Z630 Problems in relationship with spouse or partner: Secondary | ICD-10-CM | POA: Diagnosis not present

## 2017-03-07 DIAGNOSIS — Z818 Family history of other mental and behavioral disorders: Secondary | ICD-10-CM

## 2017-03-07 DIAGNOSIS — Z79899 Other long term (current) drug therapy: Secondary | ICD-10-CM

## 2017-03-07 DIAGNOSIS — F411 Generalized anxiety disorder: Secondary | ICD-10-CM

## 2017-03-07 DIAGNOSIS — Z1231 Encounter for screening mammogram for malignant neoplasm of breast: Secondary | ICD-10-CM | POA: Diagnosis not present

## 2017-03-07 MED ORDER — DESVENLAFAXINE SUCCINATE ER 50 MG PO TB24: 50.0000 mg | ORAL_TABLET | Freq: Every day | ORAL | 0 refills | Status: DC

## 2017-03-07 MED ORDER — ARIPIPRAZOLE 2 MG PO TABS: 2.0000 mg | ORAL_TABLET | Freq: Every day | ORAL | 0 refills | Status: DC

## 2017-03-07 MED ORDER — BUSPIRONE HCL 7.5 MG PO TABS: 7.5000 mg | ORAL_TABLET | Freq: Two times a day (BID) | ORAL | 0 refills | Status: DC
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
## 2017-03-14 DIAGNOSIS — K219 Gastro-esophageal reflux disease without esophagitis: Secondary | ICD-10-CM | POA: Diagnosis not present

## 2017-03-14 DIAGNOSIS — R1084 Generalized abdominal pain: Secondary | ICD-10-CM | POA: Diagnosis not present

## 2017-03-14 DIAGNOSIS — R12 Heartburn: Secondary | ICD-10-CM | POA: Diagnosis not present

## 2017-03-14 DIAGNOSIS — R109 Unspecified abdominal pain: Secondary | ICD-10-CM | POA: Diagnosis not present

## 2017-03-14 DIAGNOSIS — G8929 Other chronic pain: Secondary | ICD-10-CM | POA: Diagnosis not present

## 2017-03-14 DIAGNOSIS — K449 Diaphragmatic hernia without obstruction or gangrene: Secondary | ICD-10-CM | POA: Diagnosis not present

## 2017-03-15 ENCOUNTER — Ambulatory Visit (INDEPENDENT_AMBULATORY_CARE_PROVIDER_SITE_OTHER): Payer: BLUE CROSS/BLUE SHIELD | Admitting: Licensed Clinical Social Worker

## 2017-03-15 ENCOUNTER — Encounter (HOSPITAL_COMMUNITY): Payer: Self-pay | Admitting: Licensed Clinical Social Worker

## 2017-03-15 DIAGNOSIS — F321 Major depressive disorder, single episode, moderate: Secondary | ICD-10-CM

## 2017-03-15 DIAGNOSIS — R4581 Low self-esteem: Secondary | ICD-10-CM | POA: Diagnosis not present

## 2017-03-15 DIAGNOSIS — F419 Anxiety disorder, unspecified: Secondary | ICD-10-CM

## 2017-03-15 NOTE — Progress Notes (Signed)
   THERAPIST PROGRESS NOTE  Session Time:  4:10-5pm  Participation Level: Active  Behavioral Response: CasualAlertAnxious  Type of Therapy: Individual Therapy  Treatment Goals addressed: Coping  Interventions: Supportive  Summary: Kristine Lee is a 46 y.o. female who presents for her individual counseling session. Pt discussed her psychiatric symptoms and current life events. Pt met with her psychiatrist who increased her Buspar. Discussed her diagnosis and symptoms. Pt did not return with her homework today, writing down arguments and looking for patterns. Pt shared she and her partner practiced the "I-statement" and communication skills worksheets. Encouraged pt to continue to practice these skills. Pt began "rising strong," Almond Lint, which is assisting her as a coping tool during her quiet time. Pt has been dx with celiac disease and will begin a gluten-free lifestyle. Pt continues to journal. Gave pt module # 6 for self esteem packet for homework. Also gave pt module 1&2 for self esteem for she and her partner to work through. Pt was open to suggestions.        Suicidal/Homicidal: Nowithout intent/plan  Therapist Response: Assess pt's current functioning and reviewed progress. Assisted pt process relationship issues, journaling, self esteem, communication skills, . Assisted pt processing for the management of stressors.  Plan: Return again in 2 weeks.for individual therapy.   Diagnosis: Axis I: Anxiety state, Moderate single current episode of MDD        [name],LISBETH S, LCAS 03/15/17

## 2017-03-29 ENCOUNTER — Ambulatory Visit (HOSPITAL_COMMUNITY): Payer: Self-pay | Admitting: Licensed Clinical Social Worker

## 2017-04-12 ENCOUNTER — Encounter (HOSPITAL_COMMUNITY): Payer: Self-pay | Admitting: Licensed Clinical Social Worker

## 2017-04-12 ENCOUNTER — Ambulatory Visit (INDEPENDENT_AMBULATORY_CARE_PROVIDER_SITE_OTHER): Payer: BLUE CROSS/BLUE SHIELD | Admitting: Licensed Clinical Social Worker

## 2017-04-12 DIAGNOSIS — F419 Anxiety disorder, unspecified: Secondary | ICD-10-CM | POA: Diagnosis not present

## 2017-04-12 DIAGNOSIS — F321 Major depressive disorder, single episode, moderate: Secondary | ICD-10-CM | POA: Diagnosis not present

## 2017-04-12 NOTE — Progress Notes (Signed)
   THERAPIST PROGRESS NOTE  Session Time:  4:10-5pm  Participation Level: Active  Behavioral Response: Casual/Alert/Euthymic  Type of Therapy: Individual Therapy  Treatment Goals addressed: Coping  Interventions: Supportive  Summary: Kristine Lee is a 46 y.o. female who presents for her individual counseling session. Pt discussed her psychiatric symptoms and current life events.Pt reports she titrated down her abilify and is now completely off of the medication. She did not life the side effects of the medication. She had a death in her family today and forgot her homework assignment but will bring it to next session. Pt reports she has worked on Education administrator and her communication with her partner is much better with less disagreements. Pt does not like confrontation and so does not initiate disagreements. Processed with pt how this may interfere in healthy relationships. Pt thinks she is having less anxiety which has resulted in less anger. Pt saw her family for thanksgiving holiday which was difficult being around her sister. Asked pt open ended questions about her childhood relationships.  Discussed pt's childhood and her relationship with one of her sisters.       Suicidal/Homicidal: Nowithout intent/plan  Therapist Response: Assess pt's current functioning and reviewed progress. Assisted pt process relationship issues,  communication skills, family . Assisted pt processing for the management of stressors.  Plan: Return again in 2 weeks.for individual therapy.   Diagnosis: Axis I: Anxiety state, Moderate single current episode of MDD        [name],LISBETH S, LCAS 04/12/17

## 2017-04-26 ENCOUNTER — Ambulatory Visit (INDEPENDENT_AMBULATORY_CARE_PROVIDER_SITE_OTHER): Payer: BLUE CROSS/BLUE SHIELD | Admitting: Licensed Clinical Social Worker

## 2017-04-26 ENCOUNTER — Encounter (HOSPITAL_COMMUNITY): Payer: Self-pay | Admitting: Licensed Clinical Social Worker

## 2017-04-26 DIAGNOSIS — F321 Major depressive disorder, single episode, moderate: Secondary | ICD-10-CM | POA: Diagnosis not present

## 2017-04-26 NOTE — Progress Notes (Signed)
   THERAPIST PROGRESS NOTE  Session Time:  4:10-5pm  Participation Level: Active  Behavioral Response: Casual/Alert/Euthymic  Type of Therapy: Individual Therapy  Treatment Goals addressed: Coping  Interventions: Supportive  Summary: Kristine Lee is a 46 y.o. female who presents for her individual counseling session. Pt discussed her psychiatric symptoms and current life events. Pt asked questions about the necessity of continued meds. She says she is no long depressed. Discussed with her the importance of following what is prescribed by her psychiatrist and to speak with him about her meds at her next appt. Pt still struggles with boundaries with her partner. Role played life life scenarios. Discussed childhood family dysfunction and how it carries over into adulthood. Taught pt some effective communication skills to try and practice at home. Discussed with pt the importance of self care.     Suicidal/Homicidal: Nowithout intent/plan  Therapist Response: Assess pt's current functioning and reviewed progress. Assisted pt process relationship issues,  communication skills, family . Assisted pt processing for the management of stressors.  Plan: Return again in 2 weeks.for individual therapy.   Diagnosis: Axis I: Anxiety state, Moderate single current episode of MDD        Trai Ells S, LCAS 04/26/17

## 2017-05-10 ENCOUNTER — Ambulatory Visit (HOSPITAL_COMMUNITY): Payer: Self-pay | Admitting: Licensed Clinical Social Worker

## 2017-05-25 ENCOUNTER — Ambulatory Visit (INDEPENDENT_AMBULATORY_CARE_PROVIDER_SITE_OTHER): Payer: BLUE CROSS/BLUE SHIELD

## 2017-05-25 ENCOUNTER — Ambulatory Visit (INDEPENDENT_AMBULATORY_CARE_PROVIDER_SITE_OTHER): Payer: BLUE CROSS/BLUE SHIELD | Admitting: Podiatry

## 2017-05-25 DIAGNOSIS — M722 Plantar fascial fibromatosis: Secondary | ICD-10-CM | POA: Diagnosis not present

## 2017-05-25 DIAGNOSIS — S93402A Sprain of unspecified ligament of left ankle, initial encounter: Secondary | ICD-10-CM

## 2017-05-25 MED ORDER — CELECOXIB 200 MG PO CAPS: 200.0000 mg | ORAL_CAPSULE | Freq: Two times a day (BID) | ORAL | 3 refills | Status: AC

## 2017-05-25 MED ORDER — METHYLPREDNISOLONE 4 MG PO TBPK: ORAL_TABLET | ORAL | 0 refills | Status: DC

## 2017-05-27 NOTE — Progress Notes (Signed)
She presents today for follow-up of her plantar fasciitis to her right foot.  She states that it is still quite tender however he was doing very well until he wore the orthotics.  She states that it seems like the orthotics made it worse.  She does go on to say that she has not purchase new shoes for quite some time.  She does not want an injection.  Objective: Vital signs are stable she is alert and oriented x3.  Pulses are palpable.  She has pain on palpation medial calcaneal tubercle of the right heel.  It appears to be more supple than it was previously.  Assessment: Plantar fasciitis.  Plan: I encouraged her to follow-up with Liliane Channel to possibly adjust the orthotics but I also encouraged her to purchase a new pair of shoes and make sure that she takes the orthotics with her when she purchases the shoes to try them.  I also placed her on a Medrol Dosepak and we will follow-up with Celebrex.  I will follow-up with her in 1 month.

## 2017-05-29 ENCOUNTER — Ambulatory Visit: Payer: BLUE CROSS/BLUE SHIELD | Admitting: Orthotics

## 2017-05-29 DIAGNOSIS — S93402A Sprain of unspecified ligament of left ankle, initial encounter: Secondary | ICD-10-CM

## 2017-05-29 DIAGNOSIS — M722 Plantar fascial fibromatosis: Secondary | ICD-10-CM

## 2017-05-29 NOTE — Progress Notes (Signed)
Patient came in today complaints of f/o that she received back in the summer.  Everfeet.  She said they did not help her PF at all, in fact, she felt made it worse.  I rescanned her and will have Richy make her one with hugging arch and 4* valgus ff post to take pressure off point of insertion.

## 2017-05-31 ENCOUNTER — Encounter (HOSPITAL_COMMUNITY): Payer: Self-pay | Admitting: Licensed Clinical Social Worker

## 2017-05-31 ENCOUNTER — Ambulatory Visit (INDEPENDENT_AMBULATORY_CARE_PROVIDER_SITE_OTHER): Payer: BLUE CROSS/BLUE SHIELD | Admitting: Licensed Clinical Social Worker

## 2017-05-31 DIAGNOSIS — F321 Major depressive disorder, single episode, moderate: Secondary | ICD-10-CM | POA: Diagnosis not present

## 2017-05-31 NOTE — Progress Notes (Signed)
   THERAPIST PROGRESS NOTE  Session Time:  4:10-5pm  Participation Level: Active  Behavioral Response: Casual/Alert/Euthymic  Type of Therapy: Individual Therapy  Treatment Goals addressed: Coping  Interventions: Supportive  Summary: Kristine Lee is a 47 y.o. female who presents for her individual counseling session. Pt discussed her psychiatric symptoms and current life events. Pt reports her moods are stabilized. Pt wanted to work on her goals for 2019. Assisted pt with making SMART goals with strategies. Pt wants to work on her relationship, self care, family, boundaries. Role played boundaries with pt. Pt and partner and still monitoring their communication struggles and logging them in a journal to see a pattern.  Suggested they continue with this process..    Suicidal/Homicidal: Nowithout intent/plan  Therapist Response: Assess pt's current functioning and reviewed progress. Assisted pt process relationship issues,  communication skills, family, SMART goals.. Assisted pt processing for the management of stressors.  Plan: Return again in 1 month.for individual therapy.   Diagnosis: Axis I: Anxiety state, Moderate single current episode of MDD        [name] S, LCAS 05/31/17

## 2017-06-06 ENCOUNTER — Ambulatory Visit (HOSPITAL_COMMUNITY): Payer: Self-pay | Admitting: Psychiatry

## 2017-06-07 DIAGNOSIS — L130 Dermatitis herpetiformis: Secondary | ICD-10-CM | POA: Diagnosis not present

## 2017-06-07 DIAGNOSIS — K900 Celiac disease: Secondary | ICD-10-CM | POA: Diagnosis not present

## 2017-06-13 ENCOUNTER — Other Ambulatory Visit (HOSPITAL_COMMUNITY): Payer: Self-pay

## 2017-06-13 DIAGNOSIS — F321 Major depressive disorder, single episode, moderate: Secondary | ICD-10-CM

## 2017-06-13 DIAGNOSIS — F411 Generalized anxiety disorder: Secondary | ICD-10-CM

## 2017-06-13 MED ORDER — BUSPIRONE HCL 7.5 MG PO TABS: 7.5000 mg | ORAL_TABLET | Freq: Two times a day (BID) | ORAL | 0 refills | Status: DC

## 2017-06-13 MED ORDER — DESVENLAFAXINE SUCCINATE ER 50 MG PO TB24: 50.0000 mg | ORAL_TABLET | Freq: Every day | ORAL | 0 refills | Status: DC

## 2017-06-13 MED ORDER — ARIPIPRAZOLE 2 MG PO TABS: 2.0000 mg | ORAL_TABLET | Freq: Every day | ORAL | 0 refills | Status: DC

## 2017-06-19 ENCOUNTER — Other Ambulatory Visit: Payer: BLUE CROSS/BLUE SHIELD | Admitting: Orthotics

## 2017-06-28 ENCOUNTER — Encounter (HOSPITAL_COMMUNITY): Payer: Self-pay | Admitting: Licensed Clinical Social Worker

## 2017-06-28 ENCOUNTER — Ambulatory Visit (INDEPENDENT_AMBULATORY_CARE_PROVIDER_SITE_OTHER): Payer: BLUE CROSS/BLUE SHIELD | Admitting: Licensed Clinical Social Worker

## 2017-06-28 DIAGNOSIS — F321 Major depressive disorder, single episode, moderate: Secondary | ICD-10-CM | POA: Diagnosis not present

## 2017-06-28 NOTE — Progress Notes (Signed)
   THERAPIST PROGRESS NOTE  Session Time:  4:10-5pm  Participation Level: Active  Behavioral Response: Casual/Alert/Euthymic  Type of Therapy: Individual Therapy  Treatment Goals addressed: Coping  Interventions: Supportive  Summary: Kristine Lee is a 47 y.o. female who presents for her individual counseling session. Pt discussed her psychiatric symptoms and current life events. Pt reports her moods are stabilized. Pt will see Dr. Adele Schilder March 1 to discuss her medications. She is interested in perhaps tapering down the Prestique and had stopped taking the Abilify. She feels the Buspar is working well. Pt reports she and her partner are getting along well and continuing to use all communication skills taught in session. Pt'Lee anxiety symptoms have diminished which has also helped her relationship. They are looking to purchase a house. Asked pt open ended questions. Taught pt problem solving skills. They are purchasing an "as is" house with much needed work, which they will attempt to do themselves. Discussed basic concepts and the thought emotion connection.    Suicidal/Homicidal: Nowithout intent/plan  Therapist Response: Assess pt'Lee current functioning and reviewed progress. Assisted pt process relationship issues,  communication skills, problem solving skills.. Assisted pt processing for the management of stressors.  Plan: Return again in 1 month.for individual therapy.   Diagnosis: Axis I: Anxiety state, Moderate single current episode of MDD        Kristine Lee,Kristine Lee, LCAS 06/28/17

## 2017-06-29 ENCOUNTER — Ambulatory Visit: Payer: BLUE CROSS/BLUE SHIELD | Admitting: Podiatry

## 2017-07-04 ENCOUNTER — Other Ambulatory Visit (HOSPITAL_COMMUNITY): Payer: Self-pay | Admitting: Psychiatry

## 2017-07-04 DIAGNOSIS — F321 Major depressive disorder, single episode, moderate: Secondary | ICD-10-CM

## 2017-07-06 ENCOUNTER — Other Ambulatory Visit (HOSPITAL_COMMUNITY): Payer: Self-pay | Admitting: Psychiatry

## 2017-07-14 ENCOUNTER — Ambulatory Visit (INDEPENDENT_AMBULATORY_CARE_PROVIDER_SITE_OTHER): Payer: BLUE CROSS/BLUE SHIELD | Admitting: Psychiatry

## 2017-07-14 ENCOUNTER — Encounter (HOSPITAL_COMMUNITY): Payer: Self-pay | Admitting: Psychiatry

## 2017-07-14 DIAGNOSIS — Z818 Family history of other mental and behavioral disorders: Secondary | ICD-10-CM | POA: Diagnosis not present

## 2017-07-14 DIAGNOSIS — F321 Major depressive disorder, single episode, moderate: Secondary | ICD-10-CM | POA: Diagnosis not present

## 2017-07-14 DIAGNOSIS — F411 Generalized anxiety disorder: Secondary | ICD-10-CM

## 2017-07-14 MED ORDER — DESVENLAFAXINE SUCCINATE ER 50 MG PO TB24: 50.0000 mg | ORAL_TABLET | Freq: Every day | ORAL | 0 refills | Status: DC

## 2017-07-14 MED ORDER — BUSPIRONE HCL 7.5 MG PO TABS: 7.5000 mg | ORAL_TABLET | Freq: Two times a day (BID) | ORAL | 0 refills | Status: DC

## 2017-07-14 NOTE — Progress Notes (Signed)
1 
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
## 2017-07-26 ENCOUNTER — Encounter (HOSPITAL_COMMUNITY): Payer: Self-pay | Admitting: Licensed Clinical Social Worker

## 2017-07-26 ENCOUNTER — Ambulatory Visit (INDEPENDENT_AMBULATORY_CARE_PROVIDER_SITE_OTHER): Payer: BLUE CROSS/BLUE SHIELD | Admitting: Licensed Clinical Social Worker

## 2017-07-26 DIAGNOSIS — F321 Major depressive disorder, single episode, moderate: Secondary | ICD-10-CM

## 2017-07-26 NOTE — Progress Notes (Signed)
   THERAPIST PROGRESS NOTE  Session Time:  4:10-5pm  Participation Level: Active  Behavioral Response: Casual/Alert/Euthymic  Type of Therapy: Individual Therapy  Treatment Goals addressed: Coping  Interventions: Supportive  Summary: Kristine Lee is a 47 y.o. female who presents for her individual counseling session. Pt discussed her psychiatric symptoms and current life events. Pt reports her moods are stabilized. She saw Dr. Adele Schilder March 1 and discontinued the Abilify. She feels the Buspar is working well.Pt had gained weight due to her medication but has been on a wellness program and has lost 17 pounds. She has 7 more pounds to loose. Asked open ended questions about her wellness program. Continue to discuss communication issues in her relationship with pt. Today also discussed problem solving skills with pt. She and her partner have bought a house and her anxiety has been increasing due to the many upgrades necessary for the new house.       Suicidal/Homicidal: Nowithout intent/plan  Therapist Response: Assess pt's current functioning and reviewed progress. Assisted pt process relationship issues,  communication skills, problem solving skills, wellness program, medication managment. Assisted pt processing for the management of stressors.  Plan: Return again in 1 month.for individual therapy.   Diagnosis: Axis I:  Moderate single current episode of MDD        Beaumont Austad S, LCAS 07/26/17

## 2017-08-29 ENCOUNTER — Ambulatory Visit (INDEPENDENT_AMBULATORY_CARE_PROVIDER_SITE_OTHER): Payer: BLUE CROSS/BLUE SHIELD | Admitting: Physician Assistant

## 2017-08-29 ENCOUNTER — Encounter: Payer: Self-pay | Admitting: Physician Assistant

## 2017-08-29 ENCOUNTER — Other Ambulatory Visit: Payer: Self-pay

## 2017-08-29 VITALS — BP 100/62 | HR 80 | Temp 97.5°F | Resp 18 | Ht 64.0 in | Wt 145.0 lb

## 2017-08-29 DIAGNOSIS — F411 Generalized anxiety disorder: Secondary | ICD-10-CM

## 2017-08-29 DIAGNOSIS — E7800 Pure hypercholesterolemia, unspecified: Secondary | ICD-10-CM

## 2017-08-29 DIAGNOSIS — N3281 Overactive bladder: Secondary | ICD-10-CM | POA: Diagnosis not present

## 2017-08-29 DIAGNOSIS — K219 Gastro-esophageal reflux disease without esophagitis: Secondary | ICD-10-CM

## 2017-08-29 NOTE — Progress Notes (Signed)
Kristine Lee  MRN: [id number] DOB: [birth year]  PCP: Mancel Bale, PA-C  Chief Complaint  Patient presents with  . bloodwork    cholestrol check     Subjective:  Pt presents to clinic for 6 month recheck - she would like to have her cholesterol checked again.  She has really changed her diet with becoming gluten free and she has been doing [place] - which is a  Economist - she knows how to eat properly but this program has held her accountable for her eating - she has lost about 15-20 lbs and she is really happy with the change.   Bone density scheduled next week as well as f/u with her GI  History is obtained by patient.  Review of Systems  Genitourinary: Negative for frequency and urgency.  Psychiatric/Behavioral: The patient is nervous/anxious (she is just an anxious person - better since being on medications).     Patient Active Problem List   Diagnosis Date Noted  . Celiac disease - feels much better since finding this out and now not eating any gluten 06/07/2017  . 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 Arthralgia 03/01/2017  . 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 GERD (gastroesophageal reflux disease) - better with change to gluten free diet - improved more with recent weight loss 06/09/2016  . Urge incontinence - resolved with weight loss and listening to her body - she now goes to the bathroom when she has to go 06/09/2016  . Degeneration of cervical intervertebral disc 04/23/2015  . Anxiety and depression 04/18/2013    Current Outpatient Medications on File Prior to Visit  Medication Sig Dispense Refill  . busPIRone (BUSPAR) 7.5 MG tablet Take 1 tablet (7.5 mg total) by mouth 2 (two) times daily. 180 tablet 0  . cetirizine (ZYRTEC) 10 MG tablet Take 10 mg by mouth daily.    Marland Kitchen desvenlafaxine (PRISTIQ) 50 MG 24 hr tablet Take 1 tablet (50 mg total) by mouth daily. 90 tablet 0  . loratadine-pseudoephedrine (CLARITIN-D 12-HOUR) 5-120  MG tablet Take by mouth.    Marland Kitchen omeprazole (PRILOSEC) 40 MG capsule TAKE ONE CAPSULE BY MOUTH EVERY DAY 90 capsule 3  . celecoxib (CELEBREX) 200 MG capsule Take 1 capsule (200 mg total) by mouth 2 (two) times daily. (Patient not taking: Reported on 08/29/2017) 60 capsule 3   No current facility-administered medications on file prior to visit.     Allergies  Allergen Reactions  . Biaxin [Clarithromycin] Nausea And Vomiting  . Vancomycin Other (See Comments)    RedMan Syndrome    Past Medical History:  Diagnosis Date  . Allergy   . Anxiety   . Arthritis   . Depression   . GERD (gastroesophageal reflux disease)   . 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 Smoking status: Never Smoker  . Smokeless tobacco: Never Used  Substance Use Topics  . Alcohol use: No    Alcohol/week: 0.0 oz  . Drug use: No   family history includes Anxiety disorder in her mother, sister, and sister; Depression in her mother and sister; Diabetes in her mother, sister, and sister; Heart disease in her father.     Objective:  BP 100/62   Pulse 80   Temp Marland Kitchen)  97.5 F (36.4 C) (Oral)   Resp 18   Ht 5' 4"  (1.626 m)   Wt 145 lb (65.8 kg)   SpO2 100%   BMI 24.89 kg/m  Body mass index is 24.89 kg/m.  Physical Exam  Constitutional: She is oriented to person, place, and time.  HENT:  Head: Normocephalic and atraumatic.  Right Ear: Hearing and external ear normal.  Left Ear: Hearing and external ear normal.  Eyes: Conjunctivae are normal.  Neck: Normal range of motion.  Cardiovascular: Normal rate, regular rhythm and normal heart sounds.  No murmur heard. Pulmonary/Chest: Effort normal and breath sounds normal. She has no wheezes.  Neurological: She is alert and oriented to person, place, and time.  Skin: Skin is warm and dry.  Psychiatric: She has  a normal mood and affect. Her behavior is normal. Judgment and thought content normal.  Vitals reviewed.   Assessment and Plan :  Elevated cholesterol - Plan: Lipid panel - check labs  Overactive bladder - resolved with weight loss and behavior changes  Gastroesophageal reflux disease without esophagitis -improved - still on medications which help the remaining symptoms  Anxiety state - continue treatment with specialist  Windell Hummingbird PA-C  [facility] 08/29/2017 8:50 AM

## 2017-08-29 NOTE — Patient Instructions (Signed)
     IF you received an x-ray today, you will receive an invoice from [facility]. Please contact [facility] at [phone] with questions or concerns regarding your invoice.   IF you received labwork today, you will receive an invoice from [facility]. Please contact [facility] at [phone] with questions or concerns regarding your invoice.   Our billing staff will not be able to assist you with questions regarding bills from these companies.  You will be contacted with the lab results as soon as they are available. The fastest way to get your results is to activate your My Chart account. Instructions are located on the last page of this paperwork. If you have not heard from us regarding the results in 2 weeks, please contact this office.     

## 2017-09-06 DIAGNOSIS — Z780 Asymptomatic menopausal state: Secondary | ICD-10-CM | POA: Diagnosis not present

## 2017-09-06 DIAGNOSIS — D802 Selective deficiency of immunoglobulin A [IgA]: Secondary | ICD-10-CM | POA: Insufficient documentation

## 2017-09-06 DIAGNOSIS — K8681 Exocrine pancreatic insufficiency: Secondary | ICD-10-CM | POA: Diagnosis not present

## 2017-09-06 DIAGNOSIS — M8580 Other specified disorders of bone density and structure, unspecified site: Secondary | ICD-10-CM | POA: Diagnosis not present

## 2017-09-06 DIAGNOSIS — K900 Celiac disease: Secondary | ICD-10-CM | POA: Diagnosis not present

## 2017-09-06 DIAGNOSIS — M8589 Other specified disorders of bone density and structure, multiple sites: Secondary | ICD-10-CM | POA: Diagnosis not present

## 2017-09-11 ENCOUNTER — Other Ambulatory Visit (HOSPITAL_COMMUNITY): Payer: Self-pay | Admitting: Psychiatry

## 2017-09-11 DIAGNOSIS — F321 Major depressive disorder, single episode, moderate: Secondary | ICD-10-CM

## 2017-09-11 DIAGNOSIS — F411 Generalized anxiety disorder: Secondary | ICD-10-CM

## 2017-09-14 ENCOUNTER — Other Ambulatory Visit (HOSPITAL_COMMUNITY): Payer: Self-pay | Admitting: Psychiatry

## 2017-09-14 DIAGNOSIS — F411 Generalized anxiety disorder: Secondary | ICD-10-CM

## 2017-09-14 DIAGNOSIS — F321 Major depressive disorder, single episode, moderate: Secondary | ICD-10-CM

## 2017-09-16 LAB — LIPID PANEL
CHOL/HDL RATIO: 3.4 (calc) (ref <5.0)
CHOLESTEROL: 236 mg/dL — AB (ref <200)
HDL: 70 mg/dL (ref >50)
LDL CHOLESTEROL (CALC): 145 mg/dL — AB
Non-HDL Cholesterol (Calc): 166 mg/dL (calc) — ABNORMAL HIGH (ref <130)
TRIGLYCERIDES: 100 mg/dL (ref <150)

## 2017-09-27 ENCOUNTER — Ambulatory Visit (INDEPENDENT_AMBULATORY_CARE_PROVIDER_SITE_OTHER): Payer: BLUE CROSS/BLUE SHIELD | Admitting: Licensed Clinical Social Worker

## 2017-09-27 ENCOUNTER — Encounter (HOSPITAL_COMMUNITY): Payer: Self-pay | Admitting: Licensed Clinical Social Worker

## 2017-09-27 DIAGNOSIS — F321 Major depressive disorder, single episode, moderate: Secondary | ICD-10-CM
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
## 2017-10-23 ENCOUNTER — Encounter (HOSPITAL_COMMUNITY): Payer: Self-pay | Admitting: Psychiatry

## 2017-10-23 ENCOUNTER — Ambulatory Visit (INDEPENDENT_AMBULATORY_CARE_PROVIDER_SITE_OTHER): Payer: BLUE CROSS/BLUE SHIELD | Admitting: Psychiatry

## 2017-10-23 DIAGNOSIS — Z818 Family history of other mental and behavioral disorders: Secondary | ICD-10-CM | POA: Diagnosis not present

## 2017-10-23 DIAGNOSIS — Z62811 Personal history of psychological abuse in childhood: Secondary | ICD-10-CM | POA: Diagnosis not present

## 2017-10-23 DIAGNOSIS — Z91411 Personal history of adult psychological abuse: Secondary | ICD-10-CM | POA: Diagnosis not present

## 2017-10-23 DIAGNOSIS — F321 Major depressive disorder, single episode, moderate: Secondary | ICD-10-CM | POA: Diagnosis not present

## 2017-10-23 DIAGNOSIS — F411 Generalized anxiety disorder: Secondary | ICD-10-CM

## 2017-10-23 DIAGNOSIS — M722 Plantar fascial fibromatosis: Secondary | ICD-10-CM | POA: Diagnosis not present

## 2017-10-23 MED ORDER — DESVENLAFAXINE SUCCINATE ER 50 MG PO TB24: 50.0000 mg | ORAL_TABLET | Freq: Every day | ORAL | 0 refills | Status: DC

## 2017-10-23 MED ORDER — BUSPIRONE HCL 7.5 MG PO TABS: 7.5000 mg | ORAL_TABLET | Freq: Two times a day (BID) | ORAL | 0 refills | Status: DC

## 2017-10-23 MED ORDER — DESVENLAFAXINE SUCCINATE ER 50 MG PO TB24: 50.0000 mg | ORAL_TABLET | Freq: Every day | ORAL | 1 refills | Status: DC

## 2017-10-23 MED ORDER — BUSPIRONE HCL 7.5 MG PO TABS: 7.5000 mg | ORAL_TABLET | Freq: Two times a day (BID) | ORAL | 1 refills | Status: DC
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
## 2017-10-25 DIAGNOSIS — K900 Celiac disease: Secondary | ICD-10-CM | POA: Diagnosis not present

## 2017-10-25 DIAGNOSIS — Z713 Dietary counseling and surveillance: Secondary | ICD-10-CM | POA: Diagnosis not present

## 2017-11-08 ENCOUNTER — Ambulatory Visit (HOSPITAL_COMMUNITY): Payer: Self-pay | Admitting: Licensed Clinical Social Worker

## 2017-12-06 ENCOUNTER — Encounter (HOSPITAL_COMMUNITY): Payer: Self-pay | Admitting: Licensed Clinical Social Worker

## 2017-12-06 ENCOUNTER — Ambulatory Visit (INDEPENDENT_AMBULATORY_CARE_PROVIDER_SITE_OTHER): Payer: BLUE CROSS/BLUE SHIELD | Admitting: Licensed Clinical Social Worker

## 2017-12-06 DIAGNOSIS — F321 Major depressive disorder, single episode, moderate: Secondary | ICD-10-CM

## 2017-12-06 DIAGNOSIS — F411 Generalized anxiety disorder: Secondary | ICD-10-CM | POA: Diagnosis not present
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
## 2018-01-03 ENCOUNTER — Ambulatory Visit (INDEPENDENT_AMBULATORY_CARE_PROVIDER_SITE_OTHER): Payer: BLUE CROSS/BLUE SHIELD | Admitting: Licensed Clinical Social Worker

## 2018-01-03 ENCOUNTER — Encounter (HOSPITAL_COMMUNITY): Payer: Self-pay | Admitting: Licensed Clinical Social Worker

## 2018-01-03 DIAGNOSIS — F321 Major depressive disorder, single episode, moderate: Secondary | ICD-10-CM | POA: Diagnosis not present

## 2018-01-03 DIAGNOSIS — F411 Generalized anxiety disorder: Secondary | ICD-10-CM

## 2018-01-03 NOTE — Progress Notes (Signed)
   THERAPIST PROGRESS NOTE  Session Time:  4:10-5pm  Participation Level: Active  Behavioral Response: Casual/Alert/Euthymic  Type of Therapy: Individual Therapy  Treatment Goals addressed: Coping  Interventions: Supportive  Summary: Kristine Lee is a 47 y.o. female who presents for her individual counseling session. Pt discussed her psychiatric symptoms and current life events. Pt reports her moods are stabilized, but she still has occasional anxious moments. Pt still continues to struggle with communication at work and at home with her partner. Educated pt on more effective communication skills in both arenas. Pt was receptive to the skills lesson. Pt brought back the communication workbooks. She photocopied them and she and her partner will continue working on them at home.       Suicidal/Homicidal: Nowithout intent/plan  Therapist Response: Assess pt's current functioning and reviewed progress. Assisted pt process relationship issues,  communication skills, problem solving skills. Assisted pt processing for the management of stressors.  Plan: Return again in 1 month.for individual therapy.   Diagnosis: Axis I:  Moderate single current episode of MDD        Princesa Willig S, LCAS 07/26/17

## 2018-01-04 ENCOUNTER — Telehealth: Payer: Self-pay | Admitting: Physician Assistant

## 2018-01-04 DIAGNOSIS — M7711 Lateral epicondylitis, right elbow: Secondary | ICD-10-CM | POA: Diagnosis not present

## 2018-01-04 DIAGNOSIS — M25521 Pain in right elbow: Secondary | ICD-10-CM | POA: Diagnosis not present

## 2018-01-04 NOTE — Telephone Encounter (Signed)
Called pt to try and reschedule appt with Windell Hummingbird. Left VM for the pt to call back and reschedule their appt due to Sarahs last day (01/25/18). When pt calls back, please reschedule them at their convenience, either with Judson Roch before she leaves or a different provider of their choice. Thank you!

## 2018-01-18 DIAGNOSIS — M7711 Lateral epicondylitis, right elbow: Secondary | ICD-10-CM | POA: Diagnosis not present

## 2018-01-18 DIAGNOSIS — M25521 Pain in right elbow: Secondary | ICD-10-CM | POA: Diagnosis not present

## 2018-01-23 DIAGNOSIS — M25521 Pain in right elbow: Secondary | ICD-10-CM | POA: Diagnosis not present

## 2018-01-24 ENCOUNTER — Ambulatory Visit (INDEPENDENT_AMBULATORY_CARE_PROVIDER_SITE_OTHER): Payer: BLUE CROSS/BLUE SHIELD | Admitting: Physician Assistant

## 2018-01-24 ENCOUNTER — Encounter: Payer: Self-pay | Admitting: Physician Assistant

## 2018-01-24 ENCOUNTER — Other Ambulatory Visit: Payer: Self-pay

## 2018-01-24 VITALS — BP 102/70 | HR 89 | Temp 97.9°F | Resp 18 | Ht 64.0 in | Wt 147.6 lb

## 2018-01-24 DIAGNOSIS — J069 Acute upper respiratory infection, unspecified: Secondary | ICD-10-CM | POA: Diagnosis not present

## 2018-01-24 MED ORDER — ALBUTEROL SULFATE 108 (90 BASE) MCG/ACT IN AEPB: 2.0000 | INHALATION_SPRAY | RESPIRATORY_TRACT | 0 refills | Status: AC | PRN

## 2018-01-24 NOTE — Patient Instructions (Addendum)
   [facility] - [address], [place], [place] [address] Phone: [phone]   If you have lab work done today you will be contacted with your lab results within the next 2 weeks.  If you have not heard from us then please contact us. The fastest way to get your results is to register for My Chart.   IF you received an x-ray today, you will receive an invoice from [facility]. Please contact [facility] at [phone] with questions or concerns regarding your invoice.   IF you received labwork today, you will receive an invoice from [facility]. Please contact [facility] at [phone] with questions or concerns regarding your invoice.   Our billing staff will not be able to assist you with questions regarding bills from these companies.  You will be contacted with the lab results as soon as they are available. The fastest way to get your results is to activate your My Chart account. Instructions are located on the last page of this paperwork. If you have not heard from us regarding the results in 2 weeks, please contact this office.     

## 2018-01-24 NOTE — Progress Notes (Signed)
Kristine Lee  MRN: [id number] DOB: Apr 18, 1971  PCP: Mancel Bale, PA-C  Chief Complaint  Patient presents with  . Uri symptoms    Subjective:  Pt presents to clinic for cold symptoms for the last 3-4 days.  Started with a sore throat and hoarse.  She is taking claritin and zyrtec because she thought it might be allergies.  She feels like she has a cough that is only productive in the am.  No SOB or wheezing.  She is coughing up brown stuff in the am only.  She feels like mucus will not come up.  Wife is also currently sick with the same things.  She is going to [employer].  History is obtained by patient.  Review of Systems  Constitutional: Negative for chills and fever.  HENT: Positive for congestion, sneezing and sore throat.   Respiratory: Positive for cough. Negative for shortness of breath.   Gastrointestinal: Negative.   Allergic/Immunologic: Positive for environmental allergies.    Patient Active Problem List   Diagnosis Date Noted  . 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 Celiac disease 06/07/2017  . 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 Arthralgia 03/01/2017  . 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 GERD (gastroesophageal reflux disease) 06/09/2016  . Urge incontinence 06/09/2016  . Degeneration of cervical intervertebral disc 04/23/2015  . Anxiety and depression 04/18/2013    Current Outpatient Medications on File Prior to Visit  Medication Sig Dispense Refill  . busPIRone (BUSPAR) 7.5 MG tablet Take 1 tablet (7.5 mg total) by mouth 2 (two) times daily. 180 tablet 1  . celecoxib (CELEBREX) 200 MG capsule Take 1 capsule (200 mg total) by mouth 2 (two) times daily. 60 capsule 3  . cetirizine (ZYRTEC) 10 MG tablet Take 10 mg by mouth daily.    Marland Kitchen desvenlafaxine (PRISTIQ) 50 MG 24 hr tablet Take 1 tablet (50 mg total) by mouth daily. 90 tablet 1  . loratadine-pseudoephedrine (CLARITIN-D 12-HOUR) 5-120 MG tablet Take by mouth.    Marland Kitchen omeprazole  (PRILOSEC) 40 MG capsule TAKE ONE CAPSULE BY MOUTH EVERY DAY 90 capsule 3   No current facility-administered medications on file prior to visit.     Allergies  Allergen Reactions  . Biaxin [Clarithromycin] Nausea And Vomiting  . Vancomycin Other (See Comments)    RedMan Syndrome    Past Medical History:  Diagnosis Date  . Allergy   . Anxiety   . Arthritis   . Depression   . GERD (gastroesophageal reflux disease)   . 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 Smoking status: Never Smoker  . Smokeless tobacco: Never Used  Substance Use Topics  . Alcohol use: No    Alcohol/week: 0.0 standard drinks  . Drug use: No   family history includes Anxiety disorder in her mother, sister, and sister; Depression in her mother and sister; Diabetes in her mother, sister, and sister; Heart disease in her father.     Objective:  BP 102/70   Pulse 89   Temp 97.9 F (36.6 C) (Oral)   Resp 18   Ht 5' 4"  (1.626 m)   Wt 147 lb 9.6 oz (67 kg)   SpO2 97%   BMI 25.34 kg/m  Body mass index is 25.34 kg/m.  Wt Readings from Last 3 Encounters:  01/24/18 147 lb 9.6 oz (67 kg)  08/29/17 145 lb (65.8 kg)  02/28/17 155 lb (70.3 kg)    Physical Exam  Constitutional: She is oriented to person, place, and time. She appears well-developed and well-nourished.  HENT:  Head: Normocephalic and atraumatic.  Right Ear: Hearing, tympanic membrane, external ear and ear canal normal.  Left Ear: Hearing, tympanic membrane, external ear and ear canal normal.  Nose: Nose normal.  Mouth/Throat: Uvula is midline, oropharynx is clear and moist and mucous membranes are normal.  Eyes: Conjunctivae are normal.  Neck: Normal range of motion.  Cardiovascular: Normal rate, regular rhythm and normal heart sounds.  No murmur heard. Pulmonary/Chest: Effort  normal and breath sounds normal. She has no wheezes.  Lymphadenopathy:       Head (right side): No tonsillar, no preauricular, no posterior auricular and no occipital adenopathy present.       Head (left side): No tonsillar, no preauricular, no posterior auricular and no occipital adenopathy present.    She has no cervical adenopathy.       Right: No supraclavicular adenopathy present.       Left: No supraclavicular adenopathy present.  Neurological: She is alert and oriented to person, place, and time.  Skin: Skin is warm and dry.  Psychiatric: She has a normal mood and affect. Her behavior is normal. Judgment and thought content normal.  Vitals reviewed.   Assessment and Plan :  URI with cough and congestion - Plan: Albuterol Sulfate (PROAIR RESPICLICK) 818 (90 Base) MCG/ACT AEPB   Suspect this is viral but goes out of town will give albuterol in case she starts wheezing.  If she continues to feel this poorly in the next 7 to 10 days without any improvement will warrant antibiotic at that point perhaps.  Patient verbalized to me that they understand the following: diagnosis, what is being done for them, what to expect and what should be done at home.  Their questions have been answered.  See after visit summary for patient specific instructions.  Windell Hummingbird PA-C  [facility] 01/30/2018 9:35 PM  Please note: Portions of this report may have been transcribed using dragon voice recognition software. Every effort was made to ensure accuracy; however, inadvertent computerized transcription errors may be present.

## 2018-01-31 DIAGNOSIS — M25521 Pain in right elbow: Secondary | ICD-10-CM | POA: Diagnosis not present

## 2018-02-02 DIAGNOSIS — M25521 Pain in right elbow: Secondary | ICD-10-CM | POA: Diagnosis not present

## 2018-02-05 DIAGNOSIS — M25521 Pain in right elbow: Secondary | ICD-10-CM | POA: Diagnosis not present

## 2018-02-07 ENCOUNTER — Encounter (HOSPITAL_COMMUNITY): Payer: Self-pay | Admitting: Licensed Clinical Social Worker

## 2018-02-07 ENCOUNTER — Ambulatory Visit (INDEPENDENT_AMBULATORY_CARE_PROVIDER_SITE_OTHER): Payer: BLUE CROSS/BLUE SHIELD | Admitting: Licensed Clinical Social Worker

## 2018-02-07 DIAGNOSIS — F411 Generalized anxiety disorder: Secondary | ICD-10-CM

## 2018-02-07 NOTE — Progress Notes (Signed)
   THERAPIST PROGRESS NOTE  Session Time:  4:10-5pm  Participation Level: Active  Behavioral Response: Casual/Alert/Anxious  Type of Therapy: Individual Therapy  Treatment Goals addressed:Improve Psychiatric Symptoms, elevate mood (increased self-esteem, increased self-compassion, increased interaction), improve unhelpful thought patterns, controlled behavior, moderate mood, deliberate speech and thought process(improved social functioning, healthy adjustment to living situation), Learn about diagnosis, healthy coping skills  Interventions: Supportive  Summary: Kristine Lee is a 47 y.o. female who presents for her individual counseling session. Pt discussed her psychiatric symptoms and current life events. Pt reports her anxiety symptoms have increased. She is becoming obsessive about her house and having to fix things immediately. She has harmed herself by working too hard on the house with sprains, strains.Marland KitchenMarland KitchenMarland KitchenDiscussed compartmentalizing and prioritizing her needs for change in the house, talking with her partner about her needs, asking for what she wants. They still continue to struggle with communication. Educated pt on her communication skills. Pt admits she is calling her partner names and its' hurtful. Educated pt on making a behavior change and how to move forward with it. Pt agreed to begin coming to therapy more often. She was in agreement.    Suicidal/Homicidal: Nowithout intent/plan  Therapist Response: Assess pt's current functioning and reviewed progress. Assisted pt process relationship issues,  communication skills, problem solving skills, compartmentalizing, prioritizing and behavior change. . Assisted pt processing for the management of stressors.  Plan: Return again in 2 weeks for individual therapy.   Diagnosis: Axis I:  Anxiety state       [place], LCAS 02/07/18

## 2018-02-08 DIAGNOSIS — M25521 Pain in right elbow: Secondary | ICD-10-CM | POA: Diagnosis not present

## 2018-02-14 DIAGNOSIS — M25521 Pain in right elbow: Secondary | ICD-10-CM | POA: Diagnosis not present

## 2018-02-15 DIAGNOSIS — M7711 Lateral epicondylitis, right elbow: Secondary | ICD-10-CM | POA: Diagnosis not present

## 2018-02-15 DIAGNOSIS — M25521 Pain in right elbow: Secondary | ICD-10-CM | POA: Diagnosis not present

## 2018-02-16 DIAGNOSIS — M25521 Pain in right elbow: Secondary | ICD-10-CM | POA: Diagnosis not present

## 2018-02-20 ENCOUNTER — Ambulatory Visit (HOSPITAL_COMMUNITY): Payer: Self-pay | Admitting: Licensed Clinical Social Worker

## 2018-02-21 DIAGNOSIS — M25521 Pain in right elbow: Secondary | ICD-10-CM | POA: Diagnosis not present

## 2018-02-23 DIAGNOSIS — M25521 Pain in right elbow: Secondary | ICD-10-CM | POA: Diagnosis not present

## 2018-02-27 ENCOUNTER — Encounter: Payer: BLUE CROSS/BLUE SHIELD | Admitting: Physician Assistant

## 2018-02-28 DIAGNOSIS — M25521 Pain in right elbow: Secondary | ICD-10-CM | POA: Diagnosis not present

## 2018-03-02 DIAGNOSIS — F419 Anxiety disorder, unspecified: Secondary | ICD-10-CM | POA: Diagnosis not present

## 2018-03-02 DIAGNOSIS — Z0000 Encounter for general adult medical examination without abnormal findings: Secondary | ICD-10-CM | POA: Diagnosis not present

## 2018-03-02 DIAGNOSIS — K900 Celiac disease: Secondary | ICD-10-CM | POA: Diagnosis not present

## 2018-03-02 DIAGNOSIS — M25521 Pain in right elbow: Secondary | ICD-10-CM | POA: Diagnosis not present

## 2018-03-02 DIAGNOSIS — K219 Gastro-esophageal reflux disease without esophagitis: Secondary | ICD-10-CM | POA: Diagnosis not present

## 2018-03-07 ENCOUNTER — Ambulatory Visit (INDEPENDENT_AMBULATORY_CARE_PROVIDER_SITE_OTHER): Payer: BLUE CROSS/BLUE SHIELD | Admitting: Licensed Clinical Social Worker

## 2018-03-07 ENCOUNTER — Encounter (HOSPITAL_COMMUNITY): Payer: Self-pay | Admitting: Licensed Clinical Social Worker

## 2018-03-07 DIAGNOSIS — F411 Generalized anxiety disorder: Secondary | ICD-10-CM

## 2018-03-07 NOTE — Progress Notes (Signed)
   THERAPIST PROGRESS NOTE  Session Time:  4:30-5:20pm  Participation Level: Active  Behavioral Response: Casual/Alert/Anxious  Type of Therapy: Individual Therapy  Treatment Goals addressed:Improve Psychiatric Symptoms, elevate mood (increased self-esteem, increased self-compassion, increased interaction), improve unhelpful thought patterns, controlled behavior, moderate mood, deliberate speech and thought process(improved social functioning, healthy adjustment to living situation), Learn about diagnosis, healthy coping skills.  Interventions: CBT  Summary: Kristine Lee is a 47 y.o. female who presents for her individual counseling session. Pt discussed her psychiatric symptoms and current life events. Pt reports her anxiety symptoms have increased. She continues to be obsessive about her house and having to fix things immediately. She has harmed herself by working too hard on the house with sprains, strains.Marland KitchenMarland KitchenMarland KitchenDiscussed her unsafe behaviors. Asked if anyone previously had suggested she pushes herself to have unsafe behaviors in the discussion. Pt reports her alcohol intake has increased. She shared her mother was an alcoholic and both her maternal grandparents were alcoholics. Asked open ended questions about her drinking episodes. Will continue to monitor her alcohol use. Pt and her partner continue to have communication issues. Pt shared she is not happy in the relationship. Discussed choices with pt.  Suicidal/Homicidal: Nowithout intent/plan  Therapist Response: Assess pt's current functioning and reviewed progress. Assisted pt process relationship issues,  communication skills, alcohol use as coping tool for anxiety, family hx of alcoholism, obsessions, unsafe behaviors. Assisted pt processing for the management of stressors.  Plan: Return again in 2 weeks for individual therapy.   Diagnosis: Axis I:  Anxiety state       [place], LCAS 03/07/18

## 2018-03-09 DIAGNOSIS — M25521 Pain in right elbow: Secondary | ICD-10-CM | POA: Diagnosis not present

## 2018-03-09 DIAGNOSIS — M7711 Lateral epicondylitis, right elbow: Secondary | ICD-10-CM | POA: Diagnosis not present

## 2018-03-12 DIAGNOSIS — M25521 Pain in right elbow: Secondary | ICD-10-CM | POA: Diagnosis not present

## 2018-03-14 DIAGNOSIS — K900 Celiac disease: Secondary | ICD-10-CM | POA: Diagnosis not present

## 2018-03-14 DIAGNOSIS — Z881 Allergy status to other antibiotic agents status: Secondary | ICD-10-CM | POA: Diagnosis not present

## 2018-03-15 DIAGNOSIS — D802 Selective deficiency of immunoglobulin A [IgA]: Secondary | ICD-10-CM | POA: Diagnosis not present

## 2018-03-15 DIAGNOSIS — L281 Prurigo nodularis: Secondary | ICD-10-CM | POA: Diagnosis not present

## 2018-03-15 DIAGNOSIS — K900 Celiac disease: Secondary | ICD-10-CM | POA: Diagnosis not present

## 2018-03-15 DIAGNOSIS — L989 Disorder of the skin and subcutaneous tissue, unspecified: Secondary | ICD-10-CM | POA: Diagnosis not present

## 2018-03-15 DIAGNOSIS — R21 Rash and other nonspecific skin eruption: Secondary | ICD-10-CM | POA: Diagnosis not present

## 2018-03-19 DIAGNOSIS — M25521 Pain in right elbow: Secondary | ICD-10-CM | POA: Diagnosis not present

## 2018-03-21 ENCOUNTER — Encounter (HOSPITAL_COMMUNITY): Payer: Self-pay | Admitting: Licensed Clinical Social Worker

## 2018-03-21 ENCOUNTER — Ambulatory Visit (INDEPENDENT_AMBULATORY_CARE_PROVIDER_SITE_OTHER): Payer: BLUE CROSS/BLUE SHIELD | Admitting: Licensed Clinical Social Worker

## 2018-03-21 DIAGNOSIS — F411 Generalized anxiety disorder: Secondary | ICD-10-CM

## 2018-03-21 DIAGNOSIS — Z6826 Body mass index (BMI) 26.0-26.9, adult: Secondary | ICD-10-CM | POA: Diagnosis not present

## 2018-03-21 DIAGNOSIS — Z1231 Encounter for screening mammogram for malignant neoplasm of breast: Secondary | ICD-10-CM | POA: Diagnosis not present

## 2018-03-21 DIAGNOSIS — Z01419 Encounter for gynecological examination (general) (routine) without abnormal findings: Secondary | ICD-10-CM | POA: Diagnosis not present

## 2018-03-21 NOTE — Progress Notes (Signed)
   THERAPIST PROGRESS NOTE  Session Time:  4:30-5:20pm  Participation Level: Active  Behavioral Response: Casual/Alert/Anxious  Type of Therapy: Individual Therapy  Treatment Goals addressed:Improve Psychiatric Symptoms, elevate mood (increased self-esteem, increased self-compassion, increased interaction), improve unhelpful thought patterns, controlled behavior, moderate mood, deliberate speech and thought process(improved social functioning, healthy adjustment to living situation), Learn about diagnosis, healthy coping skills.  Interventions: CBT  Summary: Kristine Lee is a 47 y.o. female who presents for her individual counseling session. Pt discussed her psychiatric symptoms and current life events. Pt reports her anxiety symptoms have increased. She becomes increasing less happy in her relationship. "I'm not happy." Discussed options with pt. Discussed past relationships common themes, why does she choose the same women. Pt does not like being alone, historically she goes from relationship to relationship. Asked open ended questions and used empathic reflection. Role played with pt how to talk to her partner about her feelings about the relationship. Discussed pt's tone of voice, and other effective communication skills.    Suicidal/Homicidal: Nowithout intent/plan  Therapist Response: Assess pt's current functioning and reviewed progress. Assisted pt process relationship issues,  communication skills, past relationships, role plays. Assisted pt processing for the management of stressors.  Plan: Return again in 2 weeks for individual therapy. Alcohol use as a coping tool   Diagnosis: Axis I:  Anxiety state       Cebert Dettmann S, LCAS 03/21/18                                              THERAPIST PROGRESS NOTE  Session Time:  4:30-5:20pm  Participation Level: Active  Behavioral Response: Casual/Alert/Anxious  Type of  Therapy: Individual Therapy  Treatment Goals addressed:Improve Psychiatric Symptoms, elevate mood (increased self-esteem, increased self-compassion, increased interaction), improve unhelpful thought patterns, controlled behavior, moderate mood, deliberate speech and thought process(improved social functioning, healthy adjustment to living situation), Learn about diagnosis, healthy coping skills.  Interventions: CBT  Summary: Kristine Lee is a 47 y.o. female who presents for her individual counseling session. Pt discussed her psychiatric symptoms and current life events. Pt reports her anxiety symptoms have increased. She continues to be obsessive about her house and having to fix things immediately. She has harmed herself by working too hard on the house with sprains, strains.Marland KitchenMarland KitchenMarland KitchenDiscussed her unsafe behaviors. Asked if anyone previously had suggested she pushes herself to have unsafe behaviors in the discussion. Pt reports her alcohol intake has increased. She shared her mother was an alcoholic and both her maternal grandparents were alcoholics. Asked open ended questions about her drinking episodes. Will continue to monitor her alcohol use. Pt and her partner continue to have communication issues. Pt shared she is not happy in the relationship. Discussed choices with pt.  Suicidal/Homicidal: Nowithout intent/plan  Therapist Response: Assess pt's current functioning and reviewed progress. Assisted pt process relationship issues,  communication skills, alcohol use as coping tool for anxiety, family hx of alcoholism, obsessions, unsafe behaviors. Assisted pt processing for the management of stressors.  Plan: Return again in 2 weeks for individual therapy.   Diagnosis: Axis I:  Anxiety state       [place], LCAS 03/07/18

## 2018-03-26 DIAGNOSIS — M25521 Pain in right elbow: Secondary | ICD-10-CM | POA: Diagnosis not present

## 2018-04-02 DIAGNOSIS — M25521 Pain in right elbow: Secondary | ICD-10-CM | POA: Diagnosis not present

## 2018-04-09 DIAGNOSIS — H5213 Myopia, bilateral: Secondary | ICD-10-CM | POA: Diagnosis not present

## 2018-04-09 DIAGNOSIS — M7711 Lateral epicondylitis, right elbow: Secondary | ICD-10-CM | POA: Diagnosis not present

## 2018-04-11 ENCOUNTER — Ambulatory Visit (HOSPITAL_COMMUNITY): Payer: Self-pay | Admitting: Licensed Clinical Social Worker

## 2018-04-18 ENCOUNTER — Encounter (HOSPITAL_COMMUNITY): Payer: Self-pay | Admitting: Licensed Clinical Social Worker

## 2018-04-18 ENCOUNTER — Ambulatory Visit (INDEPENDENT_AMBULATORY_CARE_PROVIDER_SITE_OTHER): Payer: BLUE CROSS/BLUE SHIELD | Admitting: Licensed Clinical Social Worker

## 2018-04-18 ENCOUNTER — Ambulatory Visit (HOSPITAL_COMMUNITY): Payer: Self-pay | Admitting: Licensed Clinical Social Worker

## 2018-04-18 DIAGNOSIS — F411 Generalized anxiety disorder: Secondary | ICD-10-CM | POA: Diagnosis not present

## 2018-04-18 NOTE — Progress Notes (Signed)
   THERAPIST PROGRESS NOTE  Session Time:  4:30-5:20pm  Participation Level: Active  Behavioral Response: Casual/Alert/Anxious  Type of Therapy: Individual Therapy/TX plan review  Treatment Goals addressed:Improve Psychiatric Symptoms, elevate mood (increased self-esteem, increased self-compassion, increased interaction), improve unhelpful thought patterns, controlled behavior, moderate mood, deliberate speech and thought process(improved social functioning, healthy adjustment to living situation), Learn about diagnosis, healthy coping skills.  Interventions: CBT  Summary: Kristine Lee is a 47 y.o. female who presents for her individual counseling session. Pt discussed her psychiatric symptoms and current life events. Pt reports her anxiety symptoms continue to increase. Her relationship continues to deteriorate. She told her partner, "Im not happy." They agreed to couples counseling. Gave pt list of marriage counselors by [employer] . Role played with pt how to introduce the name to her partner to move forward in couples counseling. Discussed happiness with pt. "I don't know how to be happy." "Happiness comes from within." Asked open ended questions. Reviewed tx plan with pt.    Suicidal/Homicidal: Nowithout intent/plan  Therapist Response: Assess pt's current functioning and reviewed progress. Assisted pt process relationship issues,  Couples counseling, role plays. Assisted pt processing for the management of stressors.  Plan: Return again in 2 weeks for individual therapy. Alcohol use as a coping tool   Diagnosis: Axis I:  Anxiety state       Raahi Korber S, LCAS 04/18/18                                              THERAPIST PROGRESS NOTE  Session Time:  4:30-5:20pm  Participation Level: Active  Behavioral Response: Casual/Alert/Anxious  Type of Therapy: Individual Therapy/Tx plan review  Treatment Goals addressed:Improve  Psychiatric Symptoms, elevate mood (increased self-esteem, increased self-compassion, increased interaction), improve unhelpful thought patterns, controlled behavior, moderate mood, deliberate speech and thought process(improved social functioning, healthy adjustment to living situation), Learn about diagnosis, healthy coping skills.  Interventions: CBT  Summary: Kristine Lee is a 47 y.o. female who presents for her individual counseling session. Pt discussed her psychiatric symptoms and current life events.    Pt reports her anxiety symptoms have increased. She continues to be obsessive about her house and having to fix things immediately. She has harmed herself by working too hard on the house with sprains, strains.Marland KitchenMarland KitchenMarland KitchenDiscussed her unsafe behaviors. Asked if anyone previously had suggested she pushes herself to have unsafe behaviors in the discussion. Pt reports her alcohol intake has increased. She shared her mother was an alcoholic and both her maternal grandparents were alcoholics. Asked open ended questions about her drinking episodes. Will continue to monitor her alcohol use. Pt and her partner continue to have communication issues. Pt shared she is not happy in the relationship. Discussed choices with pt.  Suicidal/Homicidal: Nowithout intent/plan  Therapist Response: Assess pt's current functioning and reviewed progress. Assisted pt process relationship issues,  communication skills, alcohol use as coping tool for anxiety, family hx of alcoholism, obsessions, unsafe behaviors. Assisted pt processing for the management of stressors.  Plan: Return again in 2 weeks for individual therapy.   Diagnosis: Axis I:  Anxiety state       [place], LCAS 03/07/18

## 2018-04-23 DIAGNOSIS — M25521 Pain in right elbow: Secondary | ICD-10-CM | POA: Diagnosis not present

## 2018-04-24 ENCOUNTER — Ambulatory Visit (INDEPENDENT_AMBULATORY_CARE_PROVIDER_SITE_OTHER): Payer: BLUE CROSS/BLUE SHIELD | Admitting: Psychiatry

## 2018-04-24 ENCOUNTER — Encounter (HOSPITAL_COMMUNITY): Payer: Self-pay | Admitting: Psychiatry

## 2018-04-24 VITALS — BP 109/75 | HR 74 | Ht 64.0 in | Wt 155.0 lb

## 2018-04-24 DIAGNOSIS — F411 Generalized anxiety disorder: Secondary | ICD-10-CM

## 2018-04-24 DIAGNOSIS — F321 Major depressive disorder, single episode, moderate: Secondary | ICD-10-CM | POA: Diagnosis not present

## 2018-04-24 MED ORDER — DESVENLAFAXINE SUCCINATE ER 50 MG PO TB24: 50.0000 mg | ORAL_TABLET | Freq: Every day | ORAL | 0 refills | Status: DC

## 2018-04-24 MED ORDER — BUSPIRONE HCL 10 MG PO TABS: 10.0000 mg | ORAL_TABLET | Freq: Two times a day (BID) | ORAL | 0 refills | Status: DC
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
## 2018-04-26 DIAGNOSIS — M25521 Pain in right elbow: Secondary | ICD-10-CM | POA: Diagnosis not present

## 2018-04-30 DIAGNOSIS — M25521 Pain in right elbow: Secondary | ICD-10-CM | POA: Diagnosis not present

## 2018-05-02 ENCOUNTER — Ambulatory Visit (INDEPENDENT_AMBULATORY_CARE_PROVIDER_SITE_OTHER): Payer: BLUE CROSS/BLUE SHIELD | Admitting: Licensed Clinical Social Worker

## 2018-05-02 DIAGNOSIS — F411 Generalized anxiety disorder: Secondary | ICD-10-CM | POA: Diagnosis not present

## 2018-05-02 DIAGNOSIS — F321 Major depressive disorder, single episode, moderate: Secondary | ICD-10-CM | POA: Diagnosis not present

## 2018-05-03 ENCOUNTER — Encounter (HOSPITAL_COMMUNITY): Payer: Self-pay | Admitting: Licensed Clinical Social Worker

## 2018-05-03 DIAGNOSIS — M25521 Pain in right elbow: Secondary | ICD-10-CM | POA: Diagnosis not present

## 2018-05-03 NOTE — Progress Notes (Signed)
   THERAPIST PROGRESS NOTE  Session Time:  4:30-5:20pm  Participation Level: Active  Behavioral Response: Casual/Alert/Anxious  Type of Therapy: Individual Therapy/TX plan review  Treatment Goals addressed:Improve Psychiatric Symptoms, elevate mood (increased self-esteem, increased self-compassion, increased interaction), improve unhelpful thought patterns, controlled behavior, moderate mood, deliberate speech and thought process(improved social functioning, healthy adjustment to living situation), Learn about diagnosis, healthy coping skills.  Interventions: CBT  Summary: Kristine Lee is a 47 y.o. female who presents for her individual counseling session. Pt discussed her psychiatric symptoms and current life events. Pt reports her anxiety symptoms continue to increase. Her relationship continues to deteriorate. She told her partner, "Im not happy." They agreed to couples counseling. Gave pt list of marriage counselors by [employer] . Role played with pt how to introduce the name to her partner to move forward in couples counseling. Discussed happiness with pt. "I don't know how to be happy." "Happiness comes from within." Asked open ended questions. Reviewed tx plan with pt.    Suicidal/Homicidal: Nowithout intent/plan  Therapist Response: Assess pt's current functioning and reviewed progress. Assisted pt process relationship issues,  Couples counseling, role plays. Assisted pt processing for the management of stressors.  Plan: Return again in 2 weeks for individual therapy. Alcohol use as a coping tool   Diagnosis: Axis I:  Anxiety state       Kayron Hicklin S, LCAS 04/18/18                                              THERAPIST PROGRESS NOTE  Session Time:  4:30-5:20pm  Participation Level: Active  Behavioral Response: Casual/Alert/Anxious  Type of Therapy: Individual Therapy  Treatment Goals addressed:Improve Psychiatric  Symptoms, elevate mood (increased self-esteem, increased self-compassion, increased interaction), improve unhelpful thought patterns, controlled behavior, moderate mood, deliberate speech and thought process(improved social functioning, healthy adjustment to living situation), Learn about diagnosis, healthy coping skills.  Interventions: CBT  Summary: Kristine Lee is a 47 y.o. female who presents for her individual counseling session. Pt discussed her psychiatric symptoms and current life events. Pt shared she had an honest discussion with her partner about the need for partner counseling. Partner agreed but not now, too busy at work. Asked open ended questions about next steps. Pt's anxiety symptoms have increased, maybe due to working 3rd shift currently. It has brought more anxiety to her partner relationship. Pt talked about "coming out" to her family. She also shared, "Im not proud of who I am, I'm ashamed." Discussed her feelings at length.    .  Suicidal/Homicidal: Nowithout intent/plan  Therapist Response: Assess pt's current functioning and reviewed progress. Assisted pt process relationship issues,  Couples therapy, "coming out" and her feelings about her sexual identity. Assisted pt processing for the management of stressors.  Plan: Return again in 2 weeks for individual therapy.   Diagnosis: Axis I:  Moderate single current episode of MDD, anxiety state       Ary Rudnick S, LCAS 05/02/18

## 2018-05-07 DIAGNOSIS — M25521 Pain in right elbow: Secondary | ICD-10-CM | POA: Diagnosis not present

## 2018-05-10 DIAGNOSIS — M25521 Pain in right elbow: Secondary | ICD-10-CM | POA: Diagnosis not present

## 2018-05-14 DIAGNOSIS — M25521 Pain in right elbow: Secondary | ICD-10-CM | POA: Diagnosis not present

## 2018-05-30 ENCOUNTER — Ambulatory Visit (HOSPITAL_COMMUNITY): Payer: BLUE CROSS/BLUE SHIELD | Admitting: Licensed Clinical Social Worker

## 2018-06-27 ENCOUNTER — Encounter (HOSPITAL_COMMUNITY): Payer: Self-pay | Admitting: Licensed Clinical Social Worker

## 2018-06-27 ENCOUNTER — Ambulatory Visit (INDEPENDENT_AMBULATORY_CARE_PROVIDER_SITE_OTHER): Payer: BLUE CROSS/BLUE SHIELD | Admitting: Licensed Clinical Social Worker

## 2018-06-27 DIAGNOSIS — F411 Generalized anxiety disorder: Secondary | ICD-10-CM

## 2018-06-27 DIAGNOSIS — F321 Major depressive disorder, single episode, moderate: Secondary | ICD-10-CM

## 2018-06-27 NOTE — Progress Notes (Signed)
   THERAPIST PROGRESS NOTE  Session Time:  4:30-5:20pm  Participation Level: Active  Behavioral Response: Casual/Alert/Anxious  Type of Therapy: Individual Therapy/TX plan review  Treatment Goals addressed:Improve Psychiatric Symptoms, elevate mood (increased self-esteem, increased self-compassion, increased interaction), improve unhelpful thought patterns, controlled behavior, moderate mood, deliberate speech and thought process(improved social functioning, healthy adjustment to living situation), Learn about diagnosis, healthy coping skills.  Interventions: CBT  Summary: Kristine Lee is a 48 y.o. female who presents for her individual counseling session. Pt discussed her psychiatric symptoms and current life events. Pt reports her anxiety symptoms continue to increase. Her relationship continues to deteriorate. She told her partner, "Im not happy." They agreed to couples counseling. Gave pt list of marriage counselors by [employer] . Role played with pt how to introduce the name to her partner to move forward in couples counseling. Discussed happiness with pt. "I don't know how to be happy." "Happiness comes from within." Asked open ended questions. Reviewed tx plan with pt.    Suicidal/Homicidal: Nowithout intent/plan  Therapist Response: Assess pt's current functioning and reviewed progress. Assisted pt process relationship issues,  Couples counseling, role plays. Assisted pt processing for the management of stressors.  Plan: Return again in 2 weeks for individual therapy. Alcohol use as a coping tool   Diagnosis: Axis I:  Anxiety state       Nyrie Sigal S, LCAS 04/18/18                                              THERAPIST PROGRESS NOTE  Session Time:  4:30-5:20pm  Participation Level: Active  Behavioral Response: Casual/Alert/Anxious  Type of Therapy: Individual Therapy  Treatment Goals addressed:Improve Psychiatric  Symptoms, elevate mood (increased self-esteem, increased self-compassion, increased interaction), improve unhelpful thought patterns, controlled behavior, moderate mood, deliberate speech and thought process(improved social functioning, healthy adjustment to living situation), Learn about diagnosis, healthy coping skills.  Interventions: CBT  Summary: Kristine Lee is a 48 y.o. female who presents for her individual counseling session. Pt discussed her psychiatric symptoms and current life events. Pt presents anxious and angry. She still continues to have relationship problems with her partner. They have not followed through with couples counseling because her partner has many excuses. Discussed with pt the point of therapy: change. What is she willing to change in her relationship and moving forward with her life. Asked open ended questions. Pt took a good look at herself and wondered where she wants her life to go. Suggested to pt to develop a plan for her future and make strategies to meet her goal. Pt asked about a couples counselor that accepts evening appointments. Will ask CM Velva Harman about suggestions for referral.   Suicidal/Homicidal: Nowithout intent/plan  Therapist Response: Assess pt's current functioning and reviewed progress. Assisted pt process relationship issues,  Couples therapy, "coming out" and her feelings about her sexual identity. Assisted pt processing for the management of stressors.  Plan: Return again in 2 weeks for individual therapy. Plan for future, "coming out"  Diagnosis: Axis I:  Moderate single current episode of MDD, anxiety state       Tranquilino Fischler S, LCAS 05/02/18

## 2018-07-09 ENCOUNTER — Other Ambulatory Visit (HOSPITAL_COMMUNITY): Payer: Self-pay | Admitting: Psychiatry

## 2018-07-09 DIAGNOSIS — F321 Major depressive disorder, single episode, moderate: Secondary | ICD-10-CM

## 2018-07-09 DIAGNOSIS — F411 Generalized anxiety disorder: Secondary | ICD-10-CM

## 2018-07-12 DIAGNOSIS — K901 Tropical sprue: Secondary | ICD-10-CM | POA: Diagnosis not present

## 2018-07-12 DIAGNOSIS — K219 Gastro-esophageal reflux disease without esophagitis: Secondary | ICD-10-CM | POA: Diagnosis not present

## 2018-07-12 DIAGNOSIS — K529 Noninfective gastroenteritis and colitis, unspecified: Secondary | ICD-10-CM | POA: Diagnosis not present

## 2018-07-18 ENCOUNTER — Ambulatory Visit (HOSPITAL_COMMUNITY): Payer: BLUE CROSS/BLUE SHIELD | Admitting: Licensed Clinical Social Worker

## 2018-07-23 ENCOUNTER — Ambulatory Visit (HOSPITAL_COMMUNITY): Payer: BLUE CROSS/BLUE SHIELD | Admitting: Psychiatry

## 2018-08-08 DIAGNOSIS — Z8371 Family history of colonic polyps: Secondary | ICD-10-CM | POA: Diagnosis not present

## 2018-08-08 DIAGNOSIS — R768 Other specified abnormal immunological findings in serum: Secondary | ICD-10-CM | POA: Diagnosis not present

## 2018-08-08 DIAGNOSIS — K449 Diaphragmatic hernia without obstruction or gangrene: Secondary | ICD-10-CM | POA: Diagnosis not present

## 2018-08-08 DIAGNOSIS — K2980 Duodenitis without bleeding: Secondary | ICD-10-CM | POA: Diagnosis not present

## 2018-08-08 DIAGNOSIS — R197 Diarrhea, unspecified: Secondary | ICD-10-CM | POA: Diagnosis not present

## 2018-08-08 DIAGNOSIS — K219 Gastro-esophageal reflux disease without esophagitis: Secondary | ICD-10-CM | POA: Diagnosis not present

## 2018-08-10 DIAGNOSIS — K2980 Duodenitis without bleeding: Secondary | ICD-10-CM | POA: Diagnosis not present

## 2018-08-29 ENCOUNTER — Ambulatory Visit (HOSPITAL_COMMUNITY): Payer: BLUE CROSS/BLUE SHIELD | Admitting: Licensed Clinical Social Worker

## 2018-08-30 DIAGNOSIS — D802 Selective deficiency of immunoglobulin A [IgA]: Secondary | ICD-10-CM | POA: Diagnosis not present

## 2018-08-30 DIAGNOSIS — J3089 Other allergic rhinitis: Secondary | ICD-10-CM | POA: Diagnosis not present

## 2018-09-12 DIAGNOSIS — M25532 Pain in left wrist: Secondary | ICD-10-CM | POA: Diagnosis not present

## 2018-09-12 DIAGNOSIS — M25531 Pain in right wrist: Secondary | ICD-10-CM | POA: Diagnosis not present

## 2018-09-30 ENCOUNTER — Other Ambulatory Visit (HOSPITAL_COMMUNITY): Payer: Self-pay | Admitting: Psychiatry

## 2018-09-30 DIAGNOSIS — F411 Generalized anxiety disorder: Secondary | ICD-10-CM

## 2018-09-30 DIAGNOSIS — F321 Major depressive disorder, single episode, moderate: Secondary | ICD-10-CM

## 2018-10-04 ENCOUNTER — Other Ambulatory Visit (HOSPITAL_COMMUNITY): Payer: Self-pay

## 2018-10-04 DIAGNOSIS — F411 Generalized anxiety disorder: Secondary | ICD-10-CM

## 2018-10-04 DIAGNOSIS — F321 Major depressive disorder, single episode, moderate: Secondary | ICD-10-CM

## 2018-10-04 MED ORDER — BUSPIRONE HCL 10 MG PO TABS: 10.0000 mg | ORAL_TABLET | Freq: Two times a day (BID) | ORAL | 0 refills | Status: DC

## 2018-10-09 DIAGNOSIS — M25532 Pain in left wrist: Secondary | ICD-10-CM | POA: Diagnosis not present

## 2018-10-29 ENCOUNTER — Other Ambulatory Visit (HOSPITAL_COMMUNITY): Payer: Self-pay | Admitting: Psychiatry

## 2018-10-29 DIAGNOSIS — F321 Major depressive disorder, single episode, moderate: Secondary | ICD-10-CM

## 2018-10-29 DIAGNOSIS — F411 Generalized anxiety disorder: Secondary | ICD-10-CM

## 2018-11-05 DIAGNOSIS — M25561 Pain in right knee: Secondary | ICD-10-CM | POA: Diagnosis not present

## 2018-11-07 ENCOUNTER — Other Ambulatory Visit (HOSPITAL_COMMUNITY): Payer: Self-pay

## 2018-11-07 DIAGNOSIS — F321 Major depressive disorder, single episode, moderate: Secondary | ICD-10-CM

## 2018-11-07 DIAGNOSIS — F411 Generalized anxiety disorder: Secondary | ICD-10-CM

## 2018-11-07 MED ORDER — BUSPIRONE HCL 10 MG PO TABS: 10.0000 mg | ORAL_TABLET | Freq: Two times a day (BID) | ORAL | 0 refills | Status: DC

## 2018-11-10 DIAGNOSIS — M25561 Pain in right knee: Secondary | ICD-10-CM | POA: Diagnosis not present

## 2018-11-19 DIAGNOSIS — S83241A Other tear of medial meniscus, current injury, right knee, initial encounter: Secondary | ICD-10-CM | POA: Diagnosis not present

## 2018-11-19 DIAGNOSIS — M1711 Unilateral primary osteoarthritis, right knee: Secondary | ICD-10-CM | POA: Diagnosis not present

## 2018-11-19 DIAGNOSIS — S83281A Other tear of lateral meniscus, current injury, right knee, initial encounter: Secondary | ICD-10-CM | POA: Diagnosis not present

## 2018-11-19 DIAGNOSIS — M25561 Pain in right knee: Secondary | ICD-10-CM | POA: Diagnosis not present

## 2018-12-11 ENCOUNTER — Other Ambulatory Visit (HOSPITAL_COMMUNITY): Payer: Self-pay

## 2018-12-11 DIAGNOSIS — F321 Major depressive disorder, single episode, moderate: Secondary | ICD-10-CM

## 2018-12-11 DIAGNOSIS — F411 Generalized anxiety disorder: Secondary | ICD-10-CM

## 2018-12-11 MED ORDER — DESVENLAFAXINE SUCCINATE ER 50 MG PO TB24: 50.0000 mg | ORAL_TABLET | Freq: Every day | ORAL | 0 refills | Status: DC

## 2018-12-11 MED ORDER — BUSPIRONE HCL 10 MG PO TABS: 10.0000 mg | ORAL_TABLET | Freq: Two times a day (BID) | ORAL | 0 refills | Status: DC

## 2019-01-10 ENCOUNTER — Encounter (HOSPITAL_COMMUNITY): Payer: Self-pay | Admitting: Psychiatry

## 2019-01-10 ENCOUNTER — Ambulatory Visit (INDEPENDENT_AMBULATORY_CARE_PROVIDER_SITE_OTHER): Payer: BC Managed Care – PPO | Admitting: Psychiatry

## 2019-01-10 DIAGNOSIS — F321 Major depressive disorder, single episode, moderate: Secondary | ICD-10-CM

## 2019-01-10 DIAGNOSIS — J310 Chronic rhinitis: Secondary | ICD-10-CM | POA: Diagnosis not present

## 2019-01-10 DIAGNOSIS — D802 Selective deficiency of immunoglobulin A [IgA]: Secondary | ICD-10-CM | POA: Diagnosis not present

## 2019-01-10 DIAGNOSIS — R0982 Postnasal drip: Secondary | ICD-10-CM | POA: Diagnosis not present

## 2019-01-10 DIAGNOSIS — F411 Generalized anxiety disorder: Secondary | ICD-10-CM

## 2019-01-10 MED ORDER — BUSPIRONE HCL 10 MG PO TABS: 10.0000 mg | ORAL_TABLET | Freq: Two times a day (BID) | ORAL | 0 refills | Status: DC

## 2019-01-10 MED ORDER — DESVENLAFAXINE SUCCINATE ER 50 MG PO TB24: 50.0000 mg | ORAL_TABLET | Freq: Every day | ORAL | 0 refills | Status: DC
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
## 2019-01-17 DIAGNOSIS — L281 Prurigo nodularis: Secondary | ICD-10-CM | POA: Diagnosis not present

## 2019-01-17 DIAGNOSIS — L811 Chloasma: Secondary | ICD-10-CM | POA: Diagnosis not present

## 2019-03-21 DIAGNOSIS — Z23 Encounter for immunization: Secondary | ICD-10-CM | POA: Diagnosis not present

## 2019-03-21 DIAGNOSIS — Z0000 Encounter for general adult medical examination without abnormal findings: Secondary | ICD-10-CM | POA: Diagnosis not present

## 2019-03-21 DIAGNOSIS — F411 Generalized anxiety disorder: Secondary | ICD-10-CM | POA: Diagnosis not present

## 2019-03-21 DIAGNOSIS — F325 Major depressive disorder, single episode, in full remission: Secondary | ICD-10-CM | POA: Diagnosis not present

## 2019-03-21 DIAGNOSIS — R002 Palpitations: Secondary | ICD-10-CM | POA: Diagnosis not present

## 2019-03-22 DIAGNOSIS — Z20828 Contact with and (suspected) exposure to other viral communicable diseases: Secondary | ICD-10-CM | POA: Diagnosis not present

## 2019-03-27 DIAGNOSIS — E785 Hyperlipidemia, unspecified: Secondary | ICD-10-CM | POA: Diagnosis not present

## 2019-03-27 DIAGNOSIS — R0602 Shortness of breath: Secondary | ICD-10-CM | POA: Diagnosis not present

## 2019-03-27 DIAGNOSIS — R002 Palpitations: Secondary | ICD-10-CM | POA: Diagnosis not present

## 2019-03-29 DIAGNOSIS — R002 Palpitations: Secondary | ICD-10-CM | POA: Diagnosis not present

## 2019-04-01 ENCOUNTER — Other Ambulatory Visit (HOSPITAL_COMMUNITY): Payer: Self-pay | Admitting: Psychiatry

## 2019-04-01 DIAGNOSIS — F411 Generalized anxiety disorder: Secondary | ICD-10-CM

## 2019-04-01 DIAGNOSIS — F321 Major depressive disorder, single episode, moderate: Secondary | ICD-10-CM

## 2019-04-02 DIAGNOSIS — R0602 Shortness of breath: Secondary | ICD-10-CM | POA: Diagnosis not present

## 2019-04-02 DIAGNOSIS — E785 Hyperlipidemia, unspecified: Secondary | ICD-10-CM | POA: Diagnosis not present

## 2019-04-02 DIAGNOSIS — R002 Palpitations: Secondary | ICD-10-CM | POA: Diagnosis not present

## 2019-04-08 ENCOUNTER — Ambulatory Visit (INDEPENDENT_AMBULATORY_CARE_PROVIDER_SITE_OTHER): Payer: BC Managed Care – PPO | Admitting: Psychiatry

## 2019-04-08 ENCOUNTER — Encounter (HOSPITAL_COMMUNITY): Payer: Self-pay | Admitting: Psychiatry

## 2019-04-08 ENCOUNTER — Other Ambulatory Visit: Payer: Self-pay

## 2019-04-08 DIAGNOSIS — F411 Generalized anxiety disorder: Secondary | ICD-10-CM

## 2019-04-08 DIAGNOSIS — F321 Major depressive disorder, single episode, moderate: Secondary | ICD-10-CM

## 2019-04-08 MED ORDER — BUSPIRONE HCL 10 MG PO TABS: 10.0000 mg | ORAL_TABLET | Freq: Three times a day (TID) | ORAL | 0 refills | Status: DC

## 2019-04-08 MED ORDER — DESVENLAFAXINE SUCCINATE ER 50 MG PO TB24: 50.0000 mg | ORAL_TABLET | Freq: Every day | ORAL | 0 refills | Status: DC
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
## 2019-04-09 DIAGNOSIS — R002 Palpitations: Secondary | ICD-10-CM | POA: Diagnosis not present

## 2019-04-09 DIAGNOSIS — I491 Atrial premature depolarization: Secondary | ICD-10-CM | POA: Diagnosis not present

## 2019-04-09 DIAGNOSIS — I493 Ventricular premature depolarization: Secondary | ICD-10-CM | POA: Diagnosis not present

## 2019-04-25 DIAGNOSIS — Z01419 Encounter for gynecological examination (general) (routine) without abnormal findings: Secondary | ICD-10-CM | POA: Diagnosis not present

## 2019-04-25 DIAGNOSIS — Z6823 Body mass index (BMI) 23.0-23.9, adult: Secondary | ICD-10-CM | POA: Diagnosis not present

## 2019-04-25 DIAGNOSIS — Z1231 Encounter for screening mammogram for malignant neoplasm of breast: Secondary | ICD-10-CM | POA: Diagnosis not present

## 2019-04-26 DIAGNOSIS — I340 Nonrheumatic mitral (valve) insufficiency: Secondary | ICD-10-CM | POA: Diagnosis not present

## 2019-06-27 DIAGNOSIS — E785 Hyperlipidemia, unspecified: Secondary | ICD-10-CM | POA: Diagnosis not present

## 2019-06-27 DIAGNOSIS — R0602 Shortness of breath: Secondary | ICD-10-CM | POA: Diagnosis not present

## 2019-06-27 DIAGNOSIS — R002 Palpitations: Secondary | ICD-10-CM | POA: Diagnosis not present

## 2019-06-29 ENCOUNTER — Other Ambulatory Visit (HOSPITAL_COMMUNITY): Payer: Self-pay | Admitting: Psychiatry

## 2019-06-29 DIAGNOSIS — F411 Generalized anxiety disorder: Secondary | ICD-10-CM

## 2019-06-29 DIAGNOSIS — F321 Major depressive disorder, single episode, moderate: Secondary | ICD-10-CM

## 2019-07-01 DIAGNOSIS — R0602 Shortness of breath: Secondary | ICD-10-CM | POA: Diagnosis not present

## 2019-07-03 DIAGNOSIS — H524 Presbyopia: Secondary | ICD-10-CM | POA: Diagnosis not present

## 2019-07-03 DIAGNOSIS — H40013 Open angle with borderline findings, low risk, bilateral: Secondary | ICD-10-CM | POA: Diagnosis not present

## 2019-07-03 DIAGNOSIS — H5213 Myopia, bilateral: Secondary | ICD-10-CM | POA: Diagnosis not present

## 2019-07-03 DIAGNOSIS — H52203 Unspecified astigmatism, bilateral: Secondary | ICD-10-CM | POA: Diagnosis not present

## 2019-07-08 ENCOUNTER — Other Ambulatory Visit: Payer: Self-pay

## 2019-07-08 ENCOUNTER — Encounter (HOSPITAL_COMMUNITY): Payer: Self-pay | Admitting: Psychiatry

## 2019-07-08 ENCOUNTER — Ambulatory Visit (INDEPENDENT_AMBULATORY_CARE_PROVIDER_SITE_OTHER): Payer: BC Managed Care – PPO | Admitting: Psychiatry

## 2019-07-08 DIAGNOSIS — F321 Major depressive disorder, single episode, moderate: Secondary | ICD-10-CM | POA: Diagnosis not present

## 2019-07-08 DIAGNOSIS — F411 Generalized anxiety disorder: Secondary | ICD-10-CM

## 2019-07-08 MED ORDER — DESVENLAFAXINE SUCCINATE ER 50 MG PO TB24: 50.0000 mg | ORAL_TABLET | Freq: Every day | ORAL | 0 refills | Status: AC

## 2019-07-08 MED ORDER — BUSPIRONE HCL 10 MG PO TABS: 10.0000 mg | ORAL_TABLET | Freq: Three times a day (TID) | ORAL | 0 refills | Status: AC
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
## 2019-07-12 ENCOUNTER — Telehealth (HOSPITAL_COMMUNITY): Payer: Self-pay | Admitting: *Deleted

## 2019-07-12 NOTE — Telephone Encounter (Signed)
She told me she had a high liver enzyme because of drinking at that time when she had blood work.  She had stopped drinking and I recommend to have a follow-up blood work to see if liver enzymes get back to normal.  Please scan these lab results in the chart.

## 2019-07-12 NOTE — Telephone Encounter (Signed)
[facility] received lab results from [place]; Lipid profile and LFT's. Total cholesterol 213 with LDL 120. Of note pt AST was highly elevated at 214 as was  ALT 315.

## 2019-07-16 DIAGNOSIS — L811 Chloasma: Secondary | ICD-10-CM | POA: Diagnosis not present

## 2019-07-16 DIAGNOSIS — L819 Disorder of pigmentation, unspecified: Secondary | ICD-10-CM | POA: Diagnosis not present

## 2019-09-24 DIAGNOSIS — K900 Celiac disease: Secondary | ICD-10-CM | POA: Diagnosis not present

## 2019-09-24 DIAGNOSIS — F411 Generalized anxiety disorder: Secondary | ICD-10-CM | POA: Diagnosis not present

## 2019-09-24 DIAGNOSIS — D802 Selective deficiency of immunoglobulin A [IgA]: Secondary | ICD-10-CM | POA: Diagnosis not present

## 2019-09-24 DIAGNOSIS — F325 Major depressive disorder, single episode, in full remission: Secondary | ICD-10-CM | POA: Diagnosis not present

## 2019-10-03 ENCOUNTER — Ambulatory Visit (HOSPITAL_COMMUNITY): Payer: BC Managed Care – PPO | Admitting: Psychiatry

## 2019-10-03 ENCOUNTER — Telehealth (HOSPITAL_COMMUNITY): Payer: Self-pay | Admitting: Psychiatry

## 2019-10-03 NOTE — Telephone Encounter (Signed)
AST 215 and ALT 315. Labs reviwed.

## 2019-11-01 DIAGNOSIS — L570 Actinic keratosis: Secondary | ICD-10-CM | POA: Diagnosis not present

## 2020-03-26 DIAGNOSIS — R202 Paresthesia of skin: Secondary | ICD-10-CM | POA: Diagnosis not present

## 2020-03-26 DIAGNOSIS — M25521 Pain in right elbow: Secondary | ICD-10-CM | POA: Diagnosis not present

## 2022-11-30 ENCOUNTER — Other Ambulatory Visit: Payer: Self-pay | Admitting: Oncology

## 2022-11-30 DIAGNOSIS — Z006 Encounter for examination for normal comparison and control in clinical research program: Secondary | ICD-10-CM

## 2023-03-23 ENCOUNTER — Other Ambulatory Visit (HOSPITAL_COMMUNITY): Payer: 59

## 2023-03-31 ENCOUNTER — Other Ambulatory Visit (HOSPITAL_COMMUNITY): Payer: 59

## 2023-04-21 ENCOUNTER — Other Ambulatory Visit (HOSPITAL_COMMUNITY)
Admission: RE | Admit: 2023-04-21 | Discharge: 2023-04-21 | Disposition: A | Discharge status: Home / Self Care | Payer: Self-pay | Source: Ambulatory Visit | Attending: Oncology | Admitting: Oncology

## 2023-04-21 DIAGNOSIS — Z006 Encounter for examination for normal comparison and control in clinical research program: Secondary | ICD-10-CM | POA: Insufficient documentation

## 2023-05-01 LAB — GENECONNECT MOLECULAR SCREEN: Genetic Analysis Overall Interpretation: NEGATIVE
